# Patient Record
Sex: Female | Born: 1935 | Race: White | Hispanic: No | Marital: Single | Smoking: Never smoker
Health system: Southern US, Community
[De-identification: ages and names within clinical notes are randomized; demographics above are authoritative.]

## PROBLEM LIST (undated history)

## (undated) DIAGNOSIS — I219 Acute myocardial infarction, unspecified: Secondary | ICD-10-CM

## (undated) DIAGNOSIS — Z5189 Encounter for other specified aftercare: Secondary | ICD-10-CM

## (undated) DIAGNOSIS — I2510 Atherosclerotic heart disease of native coronary artery without angina pectoris: Secondary | ICD-10-CM

## (undated) DIAGNOSIS — M810 Age-related osteoporosis without current pathological fracture: Secondary | ICD-10-CM

## (undated) DIAGNOSIS — M1990 Unspecified osteoarthritis, unspecified site: Secondary | ICD-10-CM

## (undated) DIAGNOSIS — E785 Hyperlipidemia, unspecified: Secondary | ICD-10-CM

## (undated) DIAGNOSIS — I10 Essential (primary) hypertension: Secondary | ICD-10-CM

## (undated) HISTORY — DX: Acute myocardial infarction, unspecified: I21.9

## (undated) HISTORY — PX: VALVE REPLACEMENT: SUR13

## (undated) HISTORY — PX: KNEE SURGERY: SHX244

## (undated) HISTORY — DX: Encounter for other specified aftercare: Z51.89

## (undated) HISTORY — DX: Hyperlipidemia, unspecified: E78.5

## (undated) HISTORY — DX: Age-related osteoporosis without current pathological fracture: M81.0

---

## 2016-04-27 DIAGNOSIS — I350 Nonrheumatic aortic (valve) stenosis: Secondary | ICD-10-CM | POA: Insufficient documentation

## 2016-04-27 DIAGNOSIS — I10 Essential (primary) hypertension: Secondary | ICD-10-CM | POA: Insufficient documentation

## 2018-12-06 DIAGNOSIS — G500 Trigeminal neuralgia: Secondary | ICD-10-CM | POA: Insufficient documentation

## 2021-05-17 ENCOUNTER — Encounter (HOSPITAL_BASED_OUTPATIENT_CLINIC_OR_DEPARTMENT_OTHER): Payer: Self-pay

## 2021-05-17 ENCOUNTER — Other Ambulatory Visit: Payer: Self-pay

## 2021-05-17 ENCOUNTER — Encounter: Payer: Self-pay | Admitting: Obstetrics and Gynecology

## 2021-05-17 ENCOUNTER — Inpatient Hospital Stay (HOSPITAL_BASED_OUTPATIENT_CLINIC_OR_DEPARTMENT_OTHER)
Admission: EM | Admit: 2021-05-17 | Discharge: 2021-05-20 | DRG: 382 | Disposition: A | Discharge status: Home / Self Care | Payer: Medicare Other | Attending: Family Medicine | Admitting: Family Medicine

## 2021-05-17 ENCOUNTER — Emergency Department (HOSPITAL_BASED_OUTPATIENT_CLINIC_OR_DEPARTMENT_OTHER): Payer: Medicare Other

## 2021-05-17 DIAGNOSIS — Z888 Allergy status to other drugs, medicaments and biological substances status: Secondary | ICD-10-CM

## 2021-05-17 DIAGNOSIS — K224 Dyskinesia of esophagus: Secondary | ICD-10-CM | POA: Diagnosis present

## 2021-05-17 DIAGNOSIS — Z7982 Long term (current) use of aspirin: Secondary | ICD-10-CM | POA: Diagnosis not present

## 2021-05-17 DIAGNOSIS — K449 Diaphragmatic hernia without obstruction or gangrene: Secondary | ICD-10-CM | POA: Diagnosis present

## 2021-05-17 DIAGNOSIS — I10 Essential (primary) hypertension: Secondary | ICD-10-CM | POA: Diagnosis present

## 2021-05-17 DIAGNOSIS — Z79899 Other long term (current) drug therapy: Secondary | ICD-10-CM

## 2021-05-17 DIAGNOSIS — Z87891 Personal history of nicotine dependence: Secondary | ICD-10-CM

## 2021-05-17 DIAGNOSIS — I2510 Atherosclerotic heart disease of native coronary artery without angina pectoris: Secondary | ICD-10-CM | POA: Diagnosis present

## 2021-05-17 DIAGNOSIS — K253 Acute gastric ulcer without hemorrhage or perforation: Secondary | ICD-10-CM

## 2021-05-17 DIAGNOSIS — B9681 Helicobacter pylori [H. pylori] as the cause of diseases classified elsewhere: Secondary | ICD-10-CM | POA: Diagnosis present

## 2021-05-17 DIAGNOSIS — I350 Nonrheumatic aortic (valve) stenosis: Secondary | ICD-10-CM | POA: Diagnosis present

## 2021-05-17 DIAGNOSIS — K259 Gastric ulcer, unspecified as acute or chronic, without hemorrhage or perforation: Secondary | ICD-10-CM | POA: Diagnosis present

## 2021-05-17 DIAGNOSIS — Z8711 Personal history of peptic ulcer disease: Secondary | ICD-10-CM

## 2021-05-17 DIAGNOSIS — K251 Acute gastric ulcer with perforation: Secondary | ICD-10-CM

## 2021-05-17 DIAGNOSIS — D509 Iron deficiency anemia, unspecified: Secondary | ICD-10-CM | POA: Diagnosis present

## 2021-05-17 DIAGNOSIS — Z20822 Contact with and (suspected) exposure to covid-19: Secondary | ICD-10-CM | POA: Diagnosis present

## 2021-05-17 DIAGNOSIS — Z91018 Allergy to other foods: Secondary | ICD-10-CM

## 2021-05-17 DIAGNOSIS — Z952 Presence of prosthetic heart valve: Secondary | ICD-10-CM | POA: Diagnosis not present

## 2021-05-17 DIAGNOSIS — R109 Unspecified abdominal pain: Secondary | ICD-10-CM | POA: Diagnosis present

## 2021-05-17 HISTORY — DX: Atherosclerotic heart disease of native coronary artery without angina pectoris: I25.10

## 2021-05-17 HISTORY — DX: Essential (primary) hypertension: I10

## 2021-05-17 HISTORY — DX: Unspecified osteoarthritis, unspecified site: M19.90

## 2021-05-17 LAB — URINALYSIS, ROUTINE W REFLEX MICROSCOPIC
Bilirubin Urine: NEGATIVE
Glucose, UA: NEGATIVE mg/dL
Hgb urine dipstick: NEGATIVE
Ketones, ur: 20 mg/dL — AB
Leukocytes,Ua: NEGATIVE
Nitrite: NEGATIVE
Protein, ur: NEGATIVE mg/dL
Specific Gravity, Urine: >1.046 — ABNORMAL HIGH (ref 1.005–1.030)
pH: 5 (ref 5.0–8.0)

## 2021-05-17 LAB — CBC WITH DIFFERENTIAL/PLATELET
Abs Immature Granulocytes: 0.03 10*3/uL (ref 0.00–0.07)
Basophils Absolute: 0.1 10*3/uL (ref 0.0–0.1)
Basophils Relative: 1 %
Eosinophils Absolute: 0 10*3/uL (ref 0.0–0.5)
Eosinophils Relative: 1 %
HCT: 27.2 % — ABNORMAL LOW (ref 36.0–46.0)
Hemoglobin: 8.5 g/dL — ABNORMAL LOW (ref 12.0–15.0)
Immature Granulocytes: 1 %
Lymphocytes Relative: 13 %
Lymphs Abs: 0.8 10*3/uL (ref 0.7–4.0)
MCH: 23.4 pg — ABNORMAL LOW (ref 26.0–34.0)
MCHC: 31.3 g/dL (ref 30.0–36.0)
MCV: 74.7 fL — ABNORMAL LOW (ref 80.0–100.0)
Monocytes Absolute: 0.5 10*3/uL (ref 0.1–1.0)
Monocytes Relative: 7 %
Neutro Abs: 4.8 10*3/uL (ref 1.7–7.7)
Neutrophils Relative %: 77 %
Platelets: 336 10*3/uL (ref 150–400)
RBC: 3.64 MIL/uL — ABNORMAL LOW (ref 3.87–5.11)
RDW: 22.3 % — ABNORMAL HIGH (ref 11.5–15.5)
Smear Review: NORMAL
WBC: 6.2 10*3/uL (ref 4.0–10.5)
nRBC: 0 % (ref 0.0–0.2)

## 2021-05-17 LAB — RESP PANEL BY RT-PCR (FLU A&B, COVID) ARPGX2
Influenza A by PCR: NEGATIVE
Influenza B by PCR: NEGATIVE
SARS Coronavirus 2 by RT PCR: NEGATIVE

## 2021-05-17 LAB — COMPREHENSIVE METABOLIC PANEL
ALT: 13 U/L (ref 0–44)
AST: 17 U/L (ref 15–41)
Albumin: 3.9 g/dL (ref 3.5–5.0)
Alkaline Phosphatase: 67 U/L (ref 38–126)
Anion gap: 11 (ref 5–15)
BUN: 16 mg/dL (ref 8–23)
CO2: 22 mmol/L (ref 22–32)
Calcium: 9.5 mg/dL (ref 8.9–10.3)
Chloride: 103 mmol/L (ref 98–111)
Creatinine, Ser: 0.95 mg/dL (ref 0.44–1.00)
GFR, Estimated: 57 mL/min — ABNORMAL LOW (ref >60)
Glucose, Bld: 121 mg/dL — ABNORMAL HIGH (ref 70–99)
Potassium: 3.9 mmol/L (ref 3.5–5.1)
Sodium: 136 mmol/L (ref 135–145)
Total Bilirubin: 0.4 mg/dL (ref 0.3–1.2)
Total Protein: 7.4 g/dL (ref 6.5–8.1)

## 2021-05-17 LAB — OCCULT BLOOD X 1 CARD TO LAB, STOOL: Fecal Occult Bld: NEGATIVE

## 2021-05-17 LAB — LIPASE, BLOOD: Lipase: 27 U/L (ref 11–51)

## 2021-05-17 MED ORDER — ROSUVASTATIN CALCIUM 10 MG PO TABS
10.0000 mg | ORAL_TABLET | Freq: Every day | ORAL | Status: DC
  Administered 2021-05-17 – 2021-05-20 (×4): 10 mg via ORAL
  Filled 2021-05-17 (×4): qty 1

## 2021-05-17 MED ORDER — IOHEXOL 300 MG/ML  SOLN
100.0000 mL | Freq: Once | INTRAMUSCULAR | Status: AC | PRN
  Administered 2021-05-17: 100 mL via INTRAVENOUS

## 2021-05-17 MED ORDER — LISINOPRIL 20 MG PO TABS
40.0000 mg | ORAL_TABLET | Freq: Every day | ORAL | Status: DC
  Administered 2021-05-17 – 2021-05-20 (×4): 40 mg via ORAL
  Filled 2021-05-17 (×4): qty 2

## 2021-05-17 MED ORDER — SODIUM CHLORIDE 0.9% FLUSH
3.0000 mL | Freq: Two times a day (BID) | INTRAVENOUS | Status: DC
  Administered 2021-05-17 – 2021-05-20 (×6): 3 mL via INTRAVENOUS

## 2021-05-17 MED ORDER — SODIUM CHLORIDE 0.9% FLUSH
3.0000 mL | INTRAVENOUS | Status: DC | PRN
  Administered 2021-05-18: 3 mL via INTRAVENOUS

## 2021-05-17 MED ORDER — PANTOPRAZOLE SODIUM 40 MG IV SOLR
40.0000 mg | Freq: Once | INTRAVENOUS | Status: AC
  Administered 2021-05-17: 40 mg via INTRAVENOUS
  Filled 2021-05-17: qty 40

## 2021-05-17 MED ORDER — AMLODIPINE BESYLATE 5 MG PO TABS
5.0000 mg | ORAL_TABLET | Freq: Every day | ORAL | Status: DC
  Administered 2021-05-17 – 2021-05-20 (×4): 5 mg via ORAL
  Filled 2021-05-17 (×4): qty 1

## 2021-05-17 MED ORDER — PANTOPRAZOLE SODIUM 40 MG IV SOLR
40.0000 mg | Freq: Two times a day (BID) | INTRAVENOUS | Status: DC
  Administered 2021-05-17 – 2021-05-18 (×3): 40 mg via INTRAVENOUS
  Filled 2021-05-17 (×3): qty 40

## 2021-05-17 MED ORDER — ONDANSETRON 4 MG PO TBDP: 4.0000 mg | ORAL_TABLET | Freq: Four times a day (QID) | ORAL | Status: DC | PRN

## 2021-05-17 MED ORDER — SODIUM CHLORIDE 0.9 % IV SOLN: 250.0000 mL | INTRAVENOUS | Status: DC | PRN

## 2021-05-17 NOTE — ED Provider Notes (Signed)
[facility] EMERGENCY DEPARTMENT Provider Note   CSN: [id number] Arrival date & time: 05/17/21  0915     History Chief Complaint  Patient presents with   Abdominal Pain    Erika Cardenas is a 87 y.o. female.  She has a history of aortic valve replacement, hypertension, trigeminal neuralgia.  Complaining of 1 month of intermittent nausea and vomiting, often occurs a few hours after eating.  Felt a little constipated which improved with some Ex-Lax.  No urinary symptoms.  No fevers chills cough.  No blood in the vomitus.  She has her upper abdomen is sore due to forcing vomiting.  She thinks she might of lossed a little weight.  No abdominal surgeries.  Sometimes gets some back pain which improves with lying down.  The history is provided by the patient.  Emesis Severity:  Moderate Duration:  1 month Timing:  Sporadic Emesis appearance: "slime" Progression:  Unchanged Chronicity:  New Recent urination:  Normal Relieved by:  Nothing Exacerbated by: eating. Associated symptoms: no abdominal pain, no cough, no diarrhea, no fever, no headaches and no sore throat       Past Medical History:  Diagnosis Date   Arthritis    Coronary artery disease    Hypertension     There are no problems to display for this patient.   Past Surgical History:  Procedure Laterality Date   KNEE SURGERY     VALVE REPLACEMENT       OB History   No obstetric history on file.     History reviewed. No pertinent family history.  Social History   Tobacco Use   Smoking status: Never   Smokeless tobacco: Never  Substance Use Topics   Alcohol use: Never   Drug use: Never    Home Medications Prior to Admission medications   Not on File    Allergies    Coffea arabica and Lac bovis  Review of Systems   Review of Systems  Constitutional:  Negative for fever.  HENT:  Negative for sore throat.   Eyes:  Negative for visual disturbance.  Respiratory:  Negative for cough and  shortness of breath.   Cardiovascular:  Negative for chest pain.  Gastrointestinal:  Positive for nausea and vomiting. Negative for abdominal pain and diarrhea.  Genitourinary:  Negative for dysuria.  Musculoskeletal:  Positive for back pain.  Skin:  Negative for rash.  Neurological:  Negative for headaches.   Physical Exam Updated Vital Signs BP (!) 159/61 (BP Location: Right Arm)   Pulse 72   Temp 97.8 F (36.6 C) (Oral)   Resp 16   Ht 5' (1.524 m)   Wt 60.2 kg   SpO2 100%   BMI 25.92 kg/m   Physical Exam Vitals and nursing note reviewed.  Constitutional:      General: She is not in acute distress.    Appearance: Normal appearance. She is well-developed.  HENT:     Head: Normocephalic and atraumatic.  Eyes:     Conjunctiva/sclera: Conjunctivae normal.  Cardiovascular:     Rate and Rhythm: Normal rate and regular rhythm.     Heart sounds: Murmur heard.  Pulmonary:     Effort: Pulmonary effort is normal. No respiratory distress.     Breath sounds: Normal breath sounds.  Abdominal:     General: Bowel sounds are normal.     Palpations: Abdomen is soft.     Tenderness: There is no abdominal tenderness. There is no guarding or rebound.  Musculoskeletal:        General: No deformity or signs of injury. Normal range of motion.     Cervical back: Neck supple.  Skin:    General: Skin is warm and dry.  Neurological:     General: No focal deficit present.     Mental Status: She is alert.    ED Results / Procedures / Treatments   Labs (all labs ordered are listed, but only abnormal results are displayed) Labs Reviewed  COMPREHENSIVE METABOLIC PANEL - Abnormal; Notable for the following components:      Result Value   Glucose, Bld 121 (*)    GFR, Estimated 57 (*)    All other components within normal limits  CBC WITH DIFFERENTIAL/PLATELET - Abnormal; Notable for the following components:   RBC 3.64 (*)    Hemoglobin 8.5 (*)    HCT 27.2 (*)    MCV 74.7 (*)    MCH  23.4 (*)    RDW 22.3 (*)    All other components within normal limits  RESP PANEL BY RT-PCR (FLU A&B, COVID) ARPGX2  LIPASE, BLOOD  OCCULT BLOOD X 1 CARD TO LAB, STOOL  URINALYSIS, ROUTINE W REFLEX MICROSCOPIC  BASIC METABOLIC PANEL  CBC    EKG EKG Interpretation  Date/Time:  Monday May 17 2021 09:31:33 EDT Ventricular Rate:  71 PR Interval:  181 QRS Duration: 144 QT Interval:  451 QTC Calculation: 491 R Axis:   -66 Text Interpretation: Sinus rhythm Atrial premature complexes Right bundle branch block LVH with IVCD and secondary repol abnrm Borderline prolonged QT interval No old tracing to compare Confirmed by Meridee Score [phone]) on 05/17/2021 9:42:35 AM  Radiology CT ABDOMEN PELVIS W CONTRAST  Result Date: 05/17/2021 CLINICAL DATA:  Nausea, vomiting, epigastric pain for 4 weeks, back pain, weight loss EXAM: CT ABDOMEN AND PELVIS WITH CONTRAST TECHNIQUE: Multidetector CT imaging of the abdomen and pelvis was performed using the standard protocol following bolus administration of intravenous contrast. CONTRAST:  OMNIPAQUE IOHEXOL 300 MG/ML  SOLN COMPARISON:  None. FINDINGS: Lower chest: No acute abnormality. Moderate hiatal hernia, incompletely imaged, with intrathoracic position of the gastric fundus. Hepatobiliary: No solid liver abnormality is seen. No gallstones, gallbladder wall thickening, or biliary dilatation. Pancreas: Unremarkable. No pancreatic ductal dilatation or surrounding inflammatory changes. Spleen: Normal in size without significant abnormality. Adrenals/Urinary Tract: Adrenal glands are unremarkable. Kidneys are normal, without renal calculi, solid lesion, or hydronephrosis. Bladder is unremarkable. Stomach/Bowel: There is mucosal thickening, edema, and small loculations of extraluminal air about the gastric fundus at the level of the diaphragmatic hiatus (series 2, image 9). Appendix appears normal. No evidence of bowel wall thickening, distention, or  inflammatory changes. Pancolonic diverticulosis. Vascular/Lymphatic: Aortic atherosclerosis. No enlarged abdominal or pelvic lymph nodes. Reproductive: No mass or other significant abnormality. Other: No abdominal wall hernia or abnormality. No abdominopelvic ascites. Musculoskeletal: No acute or significant osseous findings. IMPRESSION: 1. There is mucosal thickening, edema, and small loculations of extraluminal air about the gastric fundus at the level of the diaphragmatic hiatus. Findings are of concerning for gastric ulcer complicated by microperforation. Underlying malignancy is a significant differential consideration. 2. Moderate hiatal hernia, incompletely imaged, with intrathoracic position of the gastric fundus. 3. Pancolonic diverticulosis without evidence of acute diverticulitis. Aortic Atherosclerosis (ICD10-I70.0). Electronically Signed   By: Lauralyn Primes M.D.   On: 05/17/2021 11:32    Procedures Procedures   Medications Ordered in ED Medications  sodium chloride flush (NS) 0.9 % injection 3 mL (3 mLs  Intravenous Given 05/17/21 1615)  sodium chloride flush (NS) 0.9 % injection 3 mL (has no administration in time range)  0.9 %  sodium chloride infusion (has no administration in time range)  amLODipine (NORVASC) tablet 5 mg (5 mg Oral Given 05/17/21 1614)  lisinopril (ZESTRIL) tablet 40 mg (40 mg Oral Given 05/17/21 1614)  rosuvastatin (CRESTOR) tablet 10 mg (10 mg Oral Given 05/17/21 1615)  pantoprazole (PROTONIX) injection 40 mg (has no administration in time range)  ondansetron (ZOFRAN-ODT) disintegrating tablet 4 mg (has no administration in time range)  iohexol (OMNIPAQUE) 300 MG/ML solution 100 mL (100 mLs Intravenous Contrast Given 05/17/21 1047)  pantoprazole (PROTONIX) injection 40 mg (40 mg Intravenous Given 05/17/21 1206)    ED Course  I have reviewed the triage vital signs and the nursing notes.  Pertinent labs & imaging results that were available during my care of the  patient were reviewed by me and considered in my medical decision making (see chart for details).  Clinical Course as of 05/17/21 1744  Mon May 17, 2021  1157 Rectal exam done with tech [facility] as chaperone.  No stool in vault.  Sent to lab for guaiac. [MB]  1208 Discussed with Dr. Matthias Hughs from [facility].  He is recommending PPI and admission to the hospital at [facility] and they will evaluate her for possible endoscopy. [MB]  1225 Discussed with [facility] hospitalist Dr. Ashok Pall who accepts the patient for admission. [MB]    Clinical Course User Index [MB] Terrilee Files, MD   MDM Rules/Calculators/A&P                         This patient complains of nausea and vomiting, intermittent upper abdominal pain and back pain weight loss; this involves an extensive number of treatment Options and is a complaint that carries with it a high risk of complications and Morbidity. The differential includes cholelithiasis, cholecystitis, peptic ulcer disease, hiatal hernia, pancreatitis, colitis  I ordered, reviewed and interpreted labs, which included CBC with normal white count, hemoglobin low unclear baseline, chemistries and LFTs fairly normal, fecal occult negative, COVID and flu negative I ordered medication IV PPI I ordered imaging studies which included CT abdomen and pelvis with contrast and I independently    visualized and interpreted imaging which showed large hiatal hernia, gastric wall thickening and microperforations concerning for gastric ulcer Additional history obtained from patient's niece Previous records obtained and reviewed in epic, no recent admissions I consulted Dr. Vivien Rossetti GI and Dr. Ashok Pall [facility] hospitalist and discussed lab and imaging findings  Critical Interventions: None  After the interventions stated above, I reevaluated the patient and found patient to be fairly asymptomatic.  She is agreeable to admission to [facility] for further evaluation of her  symptoms.   Final Clinical Impression(s) / ED Diagnoses Final diagnoses:  Acute gastric ulcer with perforation [facility])    Rx / DC Orders ED Discharge Orders     None        Terrilee Files, MD 05/17/21 1747

## 2021-05-17 NOTE — ED Notes (Signed)
Presents with abd pain for the past month, accompanied with nausea, intermittent pain in back as well. Family states she has had some weight loss as well.

## 2021-05-17 NOTE — ED Notes (Signed)
ED MD at bedside to explain additional care, exam and need for GI consult and admission

## 2021-05-17 NOTE — ED Notes (Signed)
[facility] at bedside 

## 2021-05-17 NOTE — ED Notes (Signed)
Covid Swab obtained and to the lab 

## 2021-05-17 NOTE — ED Notes (Signed)
ED Provider at bedside. 

## 2021-05-17 NOTE — ED Notes (Signed)
Informed that urine spec is requested, pt states unable to void at this time.

## 2021-05-17 NOTE — ED Triage Notes (Signed)
Pt c/o abdominal pain and vomiting for 4 weeks. Becomes nauseous after eating.  Had some constipation which resolved with miralax. Denies fevers.  C/o back pain at arrival

## 2021-05-17 NOTE — ED Notes (Signed)
PHONE HANDOFF REPORT PROVIDED TO [facility] RN AT [facility]

## 2021-05-17 NOTE — Consult Note (Signed)
Referring Provider: No ref. provider found Primary Care Physician:  Pcp, No Primary Gastroenterologist: None (unassigned)  Reason for Consultation: Complicated hiatal hernia  HPI: Erika Cardenas is a 87 y.o. female admitted to the hospital in transfer from [facility], where she presented with a 1 month history of intermittent nonspecific upper abdominal symptoms, including some mild intermittent upper abdominal discomfort, and some periodic postprandial vomiting and dry heaves.  This was enough to make her change her normal dietary routine, and her nieces, who help to look after her, finally prevailed on her to seek medical attention.    At the [facility], a CT scan showed a moderately large hiatal hernia with contained extraluminal air at the margin of the diaphragmatic hiatus.  However, the patient had a benign exam, no fever, no white count elevation, and was hungry and asking for food.  Of note, there has been an unintentional roughly 20 pound weight loss over the past year although the patient is adamant that she has an excellent appetite.  She does not have dyspeptic symptoms on a chronic basis, such as reflux (very rarely uses Tums), dysphagia, chronic nausea.   Past Medical History:  Diagnosis Date   Arthritis    Coronary artery disease    Hypertension     Past Surgical History:  Procedure Laterality Date   KNEE SURGERY     VALVE REPLACEMENT      Prior to Admission medications   Medication Sig Start Date End Date Taking? Authorizing Provider  amLODipine (NORVASC) 5 MG tablet Take 5 mg by mouth daily.   Yes [provider]  aspirin EC 81 MG tablet Take 81 mg by mouth daily. Swallow whole.   Yes [provider]  carbamazepine (TEGRETOL XR) 100 MG 12 hr tablet Take 100 mg by mouth 2 (two) times daily.   Yes [provider]  lisinopril (ZESTRIL) 40 MG tablet Take 40 mg by mouth daily.   Yes [provider]  rosuvastatin  (CRESTOR) 10 MG tablet Take 10 mg by mouth daily.   Yes [provider]    Current Facility-Administered Medications  Medication Dose Route Frequency Provider Last Rate Last Admin   0.9 %  sodium chloride infusion  250 mL Intravenous PRN Wouk, Wilfred Curtis, MD       amLODipine (NORVASC) tablet 5 mg  5 mg Oral Daily Kathrynn Running, MD   5 mg at 05/17/21 1614   lisinopril (ZESTRIL) tablet 40 mg  40 mg Oral Daily Kathrynn Running, MD   40 mg at 05/17/21 1614   pantoprazole (PROTONIX) injection 40 mg  40 mg Intravenous Q12H Wouk, Wilfred Curtis, MD       rosuvastatin (CRESTOR) tablet 10 mg  10 mg Oral Daily Kathrynn Running, MD   10 mg at 05/17/21 1615   sodium chloride flush (NS) 0.9 % injection 3 mL  3 mL Intravenous Q12H Kathrynn Running, MD   3 mL at 05/17/21 1615   sodium chloride flush (NS) 0.9 % injection 3 mL  3 mL Intravenous PRN Kathrynn Running, MD        Allergies as of 05/17/2021 - Review Complete 05/17/2021  Allergen Reaction Noted   Coffea arabica Nausea Only 12/06/2018   Lac bovis Diarrhea 12/06/2018    History reviewed. No pertinent family history.  Social History   Socioeconomic History   Marital status: Single    Spouse name: Not on file   Number of children:  Not on file   Years of education: Not on file   Highest education level: Not on file  Occupational History   Not on file  Tobacco Use   Smoking status: Never   Smokeless tobacco: Never  Substance and Sexual Activity   Alcohol use: Never   Drug use: Never   Sexual activity: Not on file  Other Topics Concern   Not on file  Social History Narrative   Not on file   Social Determinants of Health   Financial Resource Strain: Not on file  Food Insecurity: Not on file  Transportation Needs: Not on file  Physical Activity: Not on file  Stress: Not on file  Social Connections: Not on file  Intimate Partner Violence: Not on file    Review of Systems: History of remote heart valve  replacement, (bovine valve) on daily aspirin  Physical Exam: Vital signs in last 24 hours: Temp:  [97.5 F (36.4 C)-97.8 F (36.6 C)] 97.5 F (36.4 C) (06/20 1412) Pulse Rate:  [60-72] 61 (06/20 1412) Resp:  [11-22] 16 (06/20 1412) BP: (141-170)/(51-82) 170/58 (06/20 1412) SpO2:  [100 %] 100 % (06/20 1412) Weight:  [60.2 kg] 60.2 kg (06/20 0927)   General:   Alert, very spry 87 year old, well-developed, somewhat thin but well-nourished, pleasant and cooperative in NAD Head:  Normocephalic and atraumatic. Eyes:  Sclera clear, no icterus.   Conjunctiva pink. Mouth:   No ulcerations or lesions.  Oropharynx pink & moist. Neck:   No masses or thyromegaly. Lungs:  Clear throughout to auscultation.   No wheezes, crackles, or rhonchi. No evident respiratory distress. Heart:   Regular rate and rhythm; no murmurs, clicks, rubs,  or gallops. Abdomen:  Soft, nontender, and nondistended. No masses, hepatosplenomegaly or ventral hernias noted.  Quiet bowel sounds, with no succussion splash, guarding, or rebound.   Msk:   Symmetrical without gross deformities. Pulses:  Normal radial pulse is noted. Extremities:   Without clubbing, cyanosis, or edema. Neurologic:  Alert and coherent;  grossly normal neurologically. Skin:  Intact without significant lesions or rashes. Cervical Nodes:  No significant cervical adenopathy. Psych:   Alert and cooperative. Normal mood and affect.  Intake/Output from previous day: No intake/output data recorded. Intake/Output this shift: No intake/output data recorded.  Lab Results: Recent Labs    05/17/21 0953  WBC 6.2  HGB 8.5*  HCT 27.2*  PLT 336   BMET Recent Labs    05/17/21 0953  NA 136  K 3.9  CL 103  CO2 22  GLUCOSE 121*  BUN 16  CREATININE 0.95  CALCIUM 9.5   LFT Recent Labs    05/17/21 0953  PROT 7.4  ALBUMIN 3.9  AST 17  ALT 13  ALKPHOS 67  BILITOT 0.4   PT/INR No results for input(s): LABPROT, INR in the last 72  hours.  Studies/Results: CT ABDOMEN PELVIS W CONTRAST  Result Date: 05/17/2021 CLINICAL DATA:  Nausea, vomiting, epigastric pain for 4 weeks, back pain, weight loss EXAM: CT ABDOMEN AND PELVIS WITH CONTRAST TECHNIQUE: Multidetector CT imaging of the abdomen and pelvis was performed using the standard protocol following bolus administration of intravenous contrast. CONTRAST:  OMNIPAQUE IOHEXOL 300 MG/ML  SOLN COMPARISON:  None. FINDINGS: Lower chest: No acute abnormality. Moderate hiatal hernia, incompletely imaged, with intrathoracic position of the gastric fundus. Hepatobiliary: No solid liver abnormality is seen. No gallstones, gallbladder wall thickening, or biliary dilatation. Pancreas: Unremarkable. No pancreatic ductal dilatation or surrounding inflammatory changes. Spleen: Normal in size without  significant abnormality. Adrenals/Urinary Tract: Adrenal glands are unremarkable. Kidneys are normal, without renal calculi, solid lesion, or hydronephrosis. Bladder is unremarkable. Stomach/Bowel: There is mucosal thickening, edema, and small loculations of extraluminal air about the gastric fundus at the level of the diaphragmatic hiatus (series 2, image 9). Appendix appears normal. No evidence of bowel wall thickening, distention, or inflammatory changes. Pancolonic diverticulosis. Vascular/Lymphatic: Aortic atherosclerosis. No enlarged abdominal or pelvic lymph nodes. Reproductive: No mass or other significant abnormality. Other: No abdominal wall hernia or abnormality. No abdominopelvic ascites. Musculoskeletal: No acute or significant osseous findings. IMPRESSION: 1. There is mucosal thickening, edema, and small loculations of extraluminal air about the gastric fundus at the level of the diaphragmatic hiatus. Findings are of concerning for gastric ulcer complicated by microperforation. Underlying malignancy is a significant differential consideration. 2. Moderate hiatal hernia, incompletely imaged,  with intrathoracic position of the gastric fundus. 3. Pancolonic diverticulosis without evidence of acute diverticulitis. Aortic Atherosclerosis (ICD10-I70.0). Electronically Signed   By: Lauralyn Primes M.D.   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  Plan: 1.  Endoscopic evaluation tomorrow for further clarification of the patient's mucosal status.  I believe this can be safely accomplished without significant risk of causing further escape of extraluminal air, although I did mention that as a possible risk to the patient and her niece is at the bedside.  Risk of perforation, bleeding, and anesthesia problems reviewed.  2.  I believe it is safe for the patient to have a full liquid diet for the time being, so I will order that  3.  Upper GI series, hopefully to follow her endoscopy tomorrow, for better anatomic definition of her hiatal hernia  4.  I broached the idea that this might be the kind of clinical circumstance which would benefit from surgical intervention, but we will await the results of tomorrow's tests before discussing the option of inpatient surgical consultation versus outpatient evaluation.   LOS: 0 days   Katy Fitch Linkyn Gobin  05/17/2021, 4:29 PM   Pager [phone] If no answer or after 5 PM call [phone]

## 2021-05-17 NOTE — H&P (Signed)
History and Physical    Erika Cardenas VQQ:[id number] DOB: [birth year] DOA: 05/17/2021  PCP: Pcp, No  Patient coming from: home   Chief Complaint: abdominal pain, nausea and vomiting  HPI: Erika Cardenas is a 87 y.o. female with medical history significant for aortic stenosis s/p repair, htn, who presents with the above.  She reports history of an ulcer when she was a teenage, otherwise denies history gastric pathology. She denies melena or hematochezia. She takes a daily aspirin but otherwise no nsaids. She is not a drinker.  She reports one month of nausea and small-volume emesis (mainly mucous) after eating. Also has abdominal pain when this happens. When she doesn't eat she does not have abdominal pain or nausea/vomiting. No fevers, no cough. No recent hx of endoscopy.  ED Course:   Imaging shows signs possible gastric ulcer w/ microperforation. Case discussed w/ [facility],   Review of Systems: As per HPI otherwise 10 point review of systems negative.    Past Medical History:  Diagnosis Date   Arthritis    Coronary artery disease    Hypertension     Past Surgical History:  Procedure Laterality Date   KNEE SURGERY     VALVE REPLACEMENT       reports that she has never smoked. She has never used smokeless tobacco. She reports that she does not drink alcohol and does not use drugs.  Allergies  Allergen Reactions   Coffea Arabica Nausea Only   Lac Bovis Diarrhea    History reviewed. No pertinent family history.  Prior to Admission medications   Medication Sig Start Date End Date Taking? Authorizing Provider  amLODipine (NORVASC) 5 MG tablet Take 5 mg by mouth daily.   Yes [provider]  aspirin EC 81 MG tablet Take 81 mg by mouth daily. Swallow whole.   Yes [provider]  lisinopril (ZESTRIL) 40 MG tablet Take 40 mg by mouth daily.   Yes [provider]  rosuvastatin (CRESTOR) 10 MG tablet Take 10 mg by mouth daily.   Yes [provider]    Physical Exam: Vitals:   05/17/21 1200 05/17/21 1230 05/17/21 1300 05/17/21 1412  BP: (!) 147/51 (!) 153/58 (!) 141/77 (!) 170/58  Pulse: 60 68 68 61  Resp: 13 (!) 22 17 16   Temp:    (!) 97.5 F (36.4 C)  TempSrc:    Oral  SpO2: 100% 100% 100% 100%  Weight:      Height:        Constitutional: No acute distress Head: Atraumatic Eyes: Conjunctiva clear ENM: Moist mucous membranes. Normal dentition.  Neck: Supple Respiratory: Clear to auscultation bilaterally, no wheezing/rales/rhonchi. Normal respiratory effort. No accessory muscle use. . Cardiovascular: Regular rate and rhythm. Soft systolic murmur Abdomen: Non-tender, non-distended. No masses. No rebound or guarding. Positive bowel sounds. Musculoskeletal: No joint deformity upper and lower extremities. Normal ROM, no contractures. Normal muscle tone.  Skin: No rashes, lesions, or ulcers.  Extremities: No peripheral edema. Palpable peripheral pulses. Neurologic: Alert, moving all 4 extremities. Psychiatric: Normal insight and judgement.   Labs on Admission: I have personally reviewed following labs and imaging studies  CBC: Recent Labs  Lab 05/17/21 0953  WBC 6.2  NEUTROABS 4.8  HGB 8.5*  HCT 27.2*  MCV 74.7*  PLT 336   Basic Metabolic Panel: Recent Labs  Lab 05/17/21 0953  NA 136  K 3.9  CL 103  CO2 22  GLUCOSE 121*  BUN 16  CREATININE 0.95  CALCIUM  9.5   GFR: Estimated Creatinine Clearance: 31.9 mL/min (by C-G formula based on SCr of 0.95 mg/dL). Liver Function Tests: Recent Labs  Lab 05/17/21 0953  AST 17  ALT 13  ALKPHOS 67  BILITOT 0.4  PROT 7.4  ALBUMIN 3.9   Recent Labs  Lab 05/17/21 0953  LIPASE 27   No results for input(s): AMMONIA in the last 168 hours. Coagulation Profile: No results for input(s): INR, PROTIME in the last 168 hours. Cardiac Enzymes: No results for input(s): CKTOTAL, CKMB, CKMBINDEX, TROPONINI in the last 168 hours. BNP (last 3 results) No results for  input(s): PROBNP in the last 8760 hours. HbA1C: No results for input(s): HGBA1C in the last 72 hours. CBG: No results for input(s): GLUCAP in the last 168 hours. Lipid Profile: No results for input(s): CHOL, HDL, LDLCALC, TRIG, CHOLHDL, LDLDIRECT in the last 72 hours. Thyroid Function Tests: No results for input(s): TSH, T4TOTAL, FREET4, T3FREE, THYROIDAB in the last 72 hours. Anemia Panel: No results for input(s): VITAMINB12, FOLATE, FERRITIN, TIBC, IRON, RETICCTPCT in the last 72 hours. Urine analysis: No results found for: COLORURINE, APPEARANCEUR, LABSPEC, PHURINE, GLUCOSEU, HGBUR, BILIRUBINUR, KETONESUR, PROTEINUR, UROBILINOGEN, NITRITE, LEUKOCYTESUR  Radiological Exams on Admission: CT ABDOMEN PELVIS W CONTRAST  Result Date: 05/17/2021 CLINICAL DATA:  Nausea, vomiting, epigastric pain for 4 weeks, back pain, weight loss EXAM: CT ABDOMEN AND PELVIS WITH CONTRAST TECHNIQUE: Multidetector CT imaging of the abdomen and pelvis was performed using the standard protocol following bolus administration of intravenous contrast. CONTRAST:  OMNIPAQUE IOHEXOL 300 MG/ML  SOLN COMPARISON:  None. FINDINGS: Lower chest: No acute abnormality. Moderate hiatal hernia, incompletely imaged, with intrathoracic position of the gastric fundus. Hepatobiliary: No solid liver abnormality is seen. No gallstones, gallbladder wall thickening, or biliary dilatation. Pancreas: Unremarkable. No pancreatic ductal dilatation or surrounding inflammatory changes. Spleen: Normal in size without significant abnormality. Adrenals/Urinary Tract: Adrenal glands are unremarkable. Kidneys are normal, without renal calculi, solid lesion, or hydronephrosis. Bladder is unremarkable. Stomach/Bowel: There is mucosal thickening, edema, and small loculations of extraluminal air about the gastric fundus at the level of the diaphragmatic hiatus (series 2, image 9). Appendix appears normal. No evidence of bowel wall thickening, distention,  or inflammatory changes. Pancolonic diverticulosis. Vascular/Lymphatic: Aortic atherosclerosis. No enlarged abdominal or pelvic lymph nodes. Reproductive: No mass or other significant abnormality. Other: No abdominal wall hernia or abnormality. No abdominopelvic ascites. Musculoskeletal: No acute or significant osseous findings. IMPRESSION: 1. There is mucosal thickening, edema, and small loculations of extraluminal air about the gastric fundus at the level of the diaphragmatic hiatus. Findings are of concerning for gastric ulcer complicated by microperforation. Underlying malignancy is a significant differential consideration. 2. Moderate hiatal hernia, incompletely imaged, with intrathoracic position of the gastric fundus. 3. Pancolonic diverticulosis without evidence of acute diverticulitis. Aortic Atherosclerosis (ICD10-I70.0). Electronically Signed   By: Lauralyn Primes M.D.   On: 05/17/2021 11:32      Assessment/Plan Active Problems:   Gastric ulcer    # Gastric ulcer? 1 month progressive nausea/vomiting and abd pain associated with eating. CT shows mucosal thickening, edema, and small loculations of extraluminal air about the gastric fundus concerning for gastric ulcer w/ microperforatioin. Malignancy also on ddx. Hemodynamically stable. Also with anemia, likely subacute as no report of melena/hematochezia and fecal occult blood neg in the ED. Is on asa at home - NPO - PPI BID - GI consulted, will see - hold home asa - OF NOTE, THE PATIENT REQUESTS THAT WE DO NOT SHARE ANY BAD  NEWS WITH HER. INSTEAD, SHE REQUESTS WE ADVISE HER NIECE, [name], WHO IS HER PRIMARY CONTACT  # Anemia H 8.5, no priors available for comparison. No hematochezia or melena, fecal occult blood neg, suspect this is subacute. Asymptomatic - monitor  # Aortic stenosis S/p repair with biologic. No signs CHF  # HTN Here bp mildly elevated - cont home lisinopril, amlodipine, rosuvastatin    DVT prophylaxis: SCDs Code  Status: full  Family Communication: niece karen updated telephonically 6/20  Consults called: gi   Level of care: Med-Surg Status is: Inpatient  Remains inpatient appropriate because:Inpatient level of care appropriate due to severity of illness  Dispo: The patient is from: Home              Anticipated d/c is to: Home              Patient currently is not medically stable to d/c.   Difficult to place patient No        Silvano Bilis MD [facility] Pager [phone]  If 7PM-7AM, please contact night-coverage [email or web address] Password [facility]  05/17/2021, 2:48 PM

## 2021-05-18 ENCOUNTER — Inpatient Hospital Stay (HOSPITAL_COMMUNITY): Payer: Medicare Other | Admitting: Certified Registered"

## 2021-05-18 ENCOUNTER — Encounter (HOSPITAL_COMMUNITY)
Admission: EM | Disposition: A | Discharge status: Home / Self Care | Payer: Self-pay | Source: Home / Self Care | Attending: Family Medicine

## 2021-05-18 ENCOUNTER — Encounter (HOSPITAL_COMMUNITY): Payer: Self-pay | Admitting: Obstetrics and Gynecology

## 2021-05-18 ENCOUNTER — Inpatient Hospital Stay (HOSPITAL_COMMUNITY): Payer: Medicare Other

## 2021-05-18 HISTORY — PX: BIOPSY: SHX5522

## 2021-05-18 HISTORY — PX: ESOPHAGOGASTRODUODENOSCOPY (EGD) WITH PROPOFOL: SHX5813

## 2021-05-18 LAB — CBC
HCT: 25 % — ABNORMAL LOW (ref 36.0–46.0)
Hemoglobin: 7.4 g/dL — ABNORMAL LOW (ref 12.0–15.0)
MCH: 22.4 pg — ABNORMAL LOW (ref 26.0–34.0)
MCHC: 29.6 g/dL — ABNORMAL LOW (ref 30.0–36.0)
MCV: 75.8 fL — ABNORMAL LOW (ref 80.0–100.0)
Platelets: 265 10*3/uL (ref 150–400)
RBC: 3.3 MIL/uL — ABNORMAL LOW (ref 3.87–5.11)
RDW: 22.1 % — ABNORMAL HIGH (ref 11.5–15.5)
WBC: 4.6 10*3/uL (ref 4.0–10.5)
nRBC: 0 % (ref 0.0–0.2)

## 2021-05-18 LAB — HEMOGLOBIN AND HEMATOCRIT, BLOOD
HCT: 26.8 % — ABNORMAL LOW (ref 36.0–46.0)
Hemoglobin: 7.6 g/dL — ABNORMAL LOW (ref 12.0–15.0)

## 2021-05-18 LAB — BASIC METABOLIC PANEL
Anion gap: 9 (ref 5–15)
BUN: 13 mg/dL (ref 8–23)
CO2: 22 mmol/L (ref 22–32)
Calcium: 9 mg/dL (ref 8.9–10.3)
Chloride: 107 mmol/L (ref 98–111)
Creatinine, Ser: 0.89 mg/dL (ref 0.44–1.00)
GFR, Estimated: >60 mL/min (ref >60)
Glucose, Bld: 93 mg/dL (ref 70–99)
Potassium: 3.8 mmol/L (ref 3.5–5.1)
Sodium: 138 mmol/L (ref 135–145)

## 2021-05-18 LAB — ABO/RH: ABO/RH(D): O POS

## 2021-05-18 SURGERY — ESOPHAGOGASTRODUODENOSCOPY (EGD) WITH PROPOFOL
Anesthesia: Monitor Anesthesia Care

## 2021-05-18 MED ORDER — PROPOFOL 10 MG/ML IV BOLUS
INTRAVENOUS | Status: AC
  Filled 2021-05-18: qty 20

## 2021-05-18 MED ORDER — PROPOFOL 500 MG/50ML IV EMUL
INTRAVENOUS | Status: DC | PRN
  Administered 2021-05-18: 75 ug/kg/min via INTRAVENOUS

## 2021-05-18 MED ORDER — PROPOFOL 500 MG/50ML IV EMUL
INTRAVENOUS | Status: AC
  Filled 2021-05-18: qty 50

## 2021-05-18 MED ORDER — BOOST / RESOURCE BREEZE PO LIQD CUSTOM
1.0000 | Freq: Three times a day (TID) | ORAL | Status: DC
  Administered 2021-05-18 – 2021-05-20 (×5): 1 via ORAL

## 2021-05-18 MED ORDER — LACTATED RINGERS IV SOLN
INTRAVENOUS | Status: AC | PRN
  Administered 2021-05-18: 1000 mL via INTRAVENOUS

## 2021-05-18 MED ORDER — PROPOFOL 10 MG/ML IV BOLUS
INTRAVENOUS | Status: DC | PRN
  Administered 2021-05-18: 10 mg via INTRAVENOUS

## 2021-05-18 MED ORDER — SUCRALFATE 1 GM/10ML PO SUSP
1.0000 g | Freq: Three times a day (TID) | ORAL | Status: DC
  Administered 2021-05-18 – 2021-05-19 (×4): 1 g via ORAL
  Filled 2021-05-18 (×4): qty 10

## 2021-05-18 SURGICAL SUPPLY — 14 items

## 2021-05-18 NOTE — Anesthesia Preprocedure Evaluation (Signed)
Anesthesia Evaluation  Patient identified by MRN, date of birth, ID band Patient awake    Reviewed: Allergy & Precautions, NPO status , Patient's Chart, lab work & pertinent test results  Airway Mallampati: II  TM Distance: >3 FB Neck ROM: Full    Dental  (+) Teeth Intact   Pulmonary neg pulmonary ROS,    Pulmonary exam normal        Cardiovascular hypertension, Pt. on medications + CAD  + Valvular Problems/Murmurs (s/p AVR) AS  Rhythm:Regular Rate:Normal     Neuro/Psych negative neurological ROS  negative psych ROS   GI/Hepatic Neg liver ROS, PUD,   Endo/Other  negative endocrine ROS  Renal/GU negative Renal ROS  negative genitourinary   Musculoskeletal  (+) Arthritis ,   Abdominal (+)  Abdomen: soft. Bowel sounds: normal.  Peds  Hematology negative hematology ROS (+)   Anesthesia Other Findings   Reproductive/Obstetrics                             Anesthesia Physical Anesthesia Plan  ASA: 3  Anesthesia Plan: MAC   Post-op Pain Management:    Induction: Intravenous  PONV Risk Score and Plan: 2 and Propofol infusion and Treatment may vary due to age or medical condition  Airway Management Planned: Simple Face Mask, Natural Airway and Nasal Cannula  Additional Equipment: None  Intra-op Plan:   Post-operative Plan:   Informed Consent: I have reviewed the patients History and Physical, chart, labs and discussed the procedure including the risks, benefits and alternatives for the proposed anesthesia with the patient or authorized representative who has indicated his/her understanding and acceptance.     Dental advisory given  Plan Discussed with: CRNA  Anesthesia Plan Comments: (Lab Results      Component                Value               Date                      WBC                      4.6                 05/18/2021                HGB                      7.4 (L)              05/18/2021                HCT                      25.0 (L)            05/18/2021                MCV                      75.8 (L)            05/18/2021                PLT                      265  05/18/2021           Lab Results      Component                Value               Date                      NA                       138                 05/18/2021                K                        3.8                 05/18/2021                CO2                      22                  05/18/2021                GLUCOSE                  93                  05/18/2021                BUN                      13                  05/18/2021                CREATININE               0.89                05/18/2021                CALCIUM                  9.0                 05/18/2021                GFRNONAA                 >60                 05/18/2021          )        Anesthesia Quick Evaluation

## 2021-05-18 NOTE — Anesthesia Postprocedure Evaluation (Signed)
Anesthesia Post Note  Patient: Erika Cardenas  Procedure(s) Performed: ESOPHAGOGASTRODUODENOSCOPY (EGD) WITH PROPOFOL BIOPSY     Patient location during evaluation: Endoscopy Anesthesia Type: MAC Level of consciousness: awake and alert Pain management: pain level controlled Vital Signs Assessment: post-procedure vital signs reviewed and stable Respiratory status: spontaneous breathing, nonlabored ventilation, respiratory function stable and patient connected to nasal cannula oxygen Cardiovascular status: stable and blood pressure returned to baseline Postop Assessment: no apparent nausea or vomiting Anesthetic complications: no   No notable events documented.  Last Vitals:  Vitals:   05/18/21 0929 05/18/21 0950  BP: 122/63 (!) 149/62  Pulse: (!) 54 (!) 54  Resp: 16 18  Temp:    SpO2: 100% 99%    Last Pain:  Vitals:   05/18/21 0950  TempSrc:   PainSc: 0-No pain                 March Rummage [place]

## 2021-05-18 NOTE — Op Note (Signed)
[facility] Patient Name: Erika Cardenas Procedure Date: 05/18/2021 MRN: [id number] Attending MD: Bernette Redbird , MD Date of Birth: 10/05/1928 CSN: [id number] Age: 87 Admit Type: Inpatient Procedure:                Upper GI endoscopy Indications:              Epigastric abdominal pain, Abnormal CT of the GI                            tract Providers:                Bernette Redbird, MD, Fransisca Connors, Harrington Challenger,                            Technician, Yevonne Pax, CRNA Referring MD:              Medicines:                Monitored Anesthesia Care Complications:            No immediate complications. Estimated Blood Loss:     Estimated blood loss was minimal. Procedure:                Pre-Anesthesia Assessment:                           - Prior to the procedure, a History and Physical                            was performed, and patient medications and                            allergies were reviewed. The patient's tolerance of                            previous anesthesia was also reviewed. The risks                            and benefits of the procedure and the sedation                            options and risks were discussed with the patient.                            All questions were answered, and informed consent                            was obtained. Prior Anticoagulants: The patient has                            taken no previous anticoagulant or antiplatelet                            agents. ASA Grade Assessment: III - A patient with  severe systemic disease. After reviewing the risks                            and benefits, the patient was deemed in                            satisfactory condition to undergo the procedure.                           After obtaining informed consent, the endoscope was                            passed under direct vision. Throughout the                            procedure, the  patient's blood pressure, pulse, and                            oxygen saturations were monitored continuously. The                            [id number]-[id number] ([id number]) was introduced through the                            mouth, and advanced to the fourth part of duodenum.                            The upper GI endoscopy was accomplished without                            difficulty. The patient tolerated the procedure                            well. Scope In: Scope Out: Findings:      The examined esophagus was normal.      A large hiatal hernia was present. LES at 31, hiatus at 40 cm (9 cm       hiatal hernia). No obvious paraesophageal component.      One non-bleeding cratered gastric ulcer with no stigmata of bleeding was       found on the anterior wall of the stomach. The lesion was 20 mm in       largest dimension. Biopsies were taken with a cold forceps for       histology. Estimated blood loss was minimal.      The exam of the stomach was otherwise normal.      The cardia and gastric fundus were normal on retroflexion.      The examined duodenum was normal. Impression:               - Normal esophagus.                           - Large hiatal hernia (9 cm). No obvious                            paraesophageal  component.                           - Non-bleeding gastric ulcer with no stigmata of                            bleeding. On anterior wall, near but not exactly                            at, the diaphragmatic hiatus. Unclear if this is a                            Cameron ulceration, although there is no history of                            exposure to ulcerogenic medications to otherwise                            explain it. The patient states she had an "ulcer"                            about 70 years ago but there is no history of                            ongoing or recurrent ulcer disease. Biopsied.                           - Normal examined duodenum. Moderate  Sedation:      This patient was sedated with monitored anesthesia care, not moderate       sedation. Recommendation:           - Await pathology results.                           - Do an upper GI series today. Procedure Code(s):        --- Professional ---                           [phone], Esophagogastroduodenoscopy, flexible,                            transoral; with biopsy, single or multiple Diagnosis Code(s):        --- Professional ---                           K44.9, Diaphragmatic hernia without obstruction or                            gangrene                           K25.9, Gastric ulcer, unspecified as acute or                            chronic, without hemorrhage or perforation  R10.13, Epigastric pain                           R93.3, Abnormal findings on diagnostic imaging of                            other parts of digestive tract CPT copyright 2019 [employer]. All rights reserved. The codes documented in this report are preliminary and upon coder review may  be revised to meet current compliance requirements. Bernette Redbird, MD 05/18/2021 9:48:10 AM This report has been signed electronically. Number of Addenda: 0

## 2021-05-18 NOTE — Interval H&P Note (Signed)
History and Physical Interval Note:  05/18/2021 9:02 AM  Erika Cardenas  has presented today for surgery, with the diagnosis of abnormal CT of stomach.  The various methods of treatment have been discussed with the patient and family. After consideration of risks, benefits and other options for treatment, the patient has consented to  Procedure(s): ESOPHAGOGASTRODUODENOSCOPY (EGD) WITH PROPOFOL (N/A) as a surgical intervention.  The patient's history has been reviewed, patient examined, no change in status, stable for surgery.  I have reviewed the patient's chart and labs.  Questions were answered to the patient's satisfaction.     Katy Fitch Augie Vane

## 2021-05-18 NOTE — Anesthesia Procedure Notes (Signed)
Procedure Name: MAC Date/Time: 05/18/2021 9:03 AM Performed by: Niel Hummer, CRNA Pre-anesthesia Checklist: Patient identified, Emergency Drugs available, Suction available and Patient being monitored Oxygen Delivery Method: Simple face mask

## 2021-05-18 NOTE — Plan of Care (Addendum)
Pt reports no acute distress or pain; able to ambulate to the bathroom with side assistance; slept comfortably through the night; consent for today's EGD explained and signed; bed at lowest position for safety and call light within reach.

## 2021-05-18 NOTE — Progress Notes (Signed)
PROGRESS NOTE    Erika Cardenas  TDD:[id number] DOB: [birth year] DOA: 05/17/2021 PCP: Pcp, No  Chief Complaint  Patient presents with   Abdominal Pain   Brief Narrative:  Erika Cardenas is Erika Cardenas 87 y.o. female with medical history significant for aortic stenosis s/p repair, htn, who presents with abdominal pain, nausea, vomiting.   She reports history of an ulcer when she was Erika Cardenas teenage, otherwise denies history gastric pathology. She denies melena or hematochezia. She takes Erika Cardenas daily aspirin but otherwise no nsaids. She is not Erika Cardenas drinker.   She reports one month of nausea and small-volume emesis (mainly mucous) after eating. Also has abdominal pain when this happens. When she doesn't eat she does not have abdominal pain or nausea/vomiting. No fevers, no cough. No recent hx of endoscopy.   Assessment & Plan:   Active Problems:   Gastric ulcer  # Gastric ulcer 1 month progressive nausea/vomiting and abd pain associated with eating. CT shows mucosal thickening, edema, and small loculations of extraluminal air about the gastric fundus concerning for gastric ulcer w/ microperforatioin.  - EGD with large hiatal hernia, nonbleeding gastric ulcer (biopsied) - planning for upper GI series - NPO, diet per GI  - PPI BID - GI consulted, appreciate recommendations - hold home asa - OF NOTE, THE PATIENT REQUESTS THAT WE DO NOT SHARE ANY BAD NEWS WITH HER. INSTEAD, SHE REQUESTS WE ADVISE HER NIECE, Erika Cardenas, WHO IS HER PRIMARY CONTACT   # Anemia - Hb downtrending today - repeat in afternoon - anemia labs - monitor   # Aortic stenosis S/p repair with bioprosthetic valve. No signs CHF Aspirin on hold    # HTN - cont home lisinopril, amlodipine, rosuvastatin   DVT prophylaxis: SCD Code Status: full Family Communication nieces at bedside x2 Disposition:   Status is: Inpatient  Remains inpatient appropriate because:Inpatient level of care appropriate due to severity of illness  Dispo: The patient is  from: Home              Anticipated d/c is to: Home              Patient currently is not medically stable to d/c.   Difficult to place patient No       Consultants:  GI  Procedures:  EGD - Normal esophagus. - Large hiatal hernia (9 cm). No obvious paraesophageal component. - Non-bleeding gastric ulcer with no stigmata of bleeding. On anterior wall, near but not exactly at, the diaphragmatic hiatus. Unclear if this is Erika Cardenas ulceration, although there is no history of exposure to ulcerogenic medications to otherwise explain it. The patient states she had an "ulcer" about 70 years ago but there is no history of ongoing or recurrent ulcer disease. Biopsied. - Normal examined duodenum.  - Await pathology results. - Do an upper GI series today.  Antimicrobials:  Anti-infectives (From admission, onward)    None          Subjective: No new complaints  Objective: Vitals:   05/18/21 0817 05/18/21 0928 05/18/21 0929 05/18/21 0950  BP: (!) 160/58 (!) 121/51 122/63 (!) 149/62  Pulse: 65 65 (!) 54 (!) 54  Resp: 16 16 16 18   Temp: 98.1 F (36.7 C) 97.7 F (36.5 C)    TempSrc: Oral Oral    SpO2: 100% 100% 100% 99%  Weight: 60.2 kg     Height: 5' (1.524 m)       Intake/Output Summary (Last 24 hours) at 05/18/2021 1039 Last data filed at  05/18/2021 0925 Gross per 24 hour  Intake 200 ml  Output 600 ml  Net -400 ml   Filed Weights   05/17/21 0927 05/18/21 0817  Weight: 60.2 kg 60.2 kg    Examination:  General exam: Appears calm and comfortable, pleasant Respiratory system: Clear to auscultation. Respiratory effort normal. Cardiovascular system: S1 & S2 heard, RRR.  Gastrointestinal system: Abdomen is nondistended, soft and nontender. Central nervous system: Alert and oriented. No focal neurological deficits. Extremities: no LEE Skin: No rashes, lesions or ulcers Psychiatry: Judgement and insight appear normal. Mood & affect appropriate.     Data Reviewed:  I have personally reviewed following labs and imaging studies  CBC: Recent Labs  Lab 05/17/21 0953 05/18/21 0608  WBC 6.2 4.6  NEUTROABS 4.8  --   HGB 8.5* 7.4*  HCT 27.2* 25.0*  MCV 74.7* 75.8*  PLT 336 265    Basic Metabolic Panel: Recent Labs  Lab 05/17/21 0953 05/18/21 0608  NA 136 138  K 3.9 3.8  CL 103 107  CO2 22 22  GLUCOSE 121* 93  BUN 16 13  CREATININE 0.95 0.89  CALCIUM 9.5 9.0    GFR: Estimated Creatinine Clearance: 34.1 mL/min (by C-G formula based on SCr of 0.89 mg/dL).  Liver Function Tests: Recent Labs  Lab 05/17/21 0953  AST 17  ALT 13  ALKPHOS 67  BILITOT 0.4  PROT 7.4  ALBUMIN 3.9    CBG: No results for input(s): GLUCAP in the last 168 hours.   Recent Results (from the past 240 hour(s))  Resp Panel by RT-PCR (Flu Erika Cardenas&B, Covid) Nasopharyngeal Swab     Status: None   Collection Time: 05/17/21 12:35 PM   Specimen: Nasopharyngeal Swab; Nasopharyngeal(NP) swabs in vial transport medium  Result Value Ref Range Status   SARS Coronavirus 2 by RT PCR NEGATIVE NEGATIVE Final    Comment: (NOTE) SARS-CoV-2 target nucleic acids are NOT DETECTED.  The SARS-CoV-2 RNA is generally detectable in upper respiratory specimens during the acute phase of infection. The lowest concentration of SARS-CoV-2 viral copies this assay can detect is 138 copies/mL. Erika Cardenas negative result does not preclude SARS-Cov-2 infection and should not be used as the sole basis for treatment or other patient management decisions. Erika Cardenas negative result may occur with  improper specimen collection/handling, submission of specimen other than nasopharyngeal swab, presence of viral mutation(s) within the areas targeted by this assay, and inadequate number of viral copies(<138 copies/mL). Erika Cardenas negative result must be combined with clinical observations, patient history, and epidemiological information. The expected result is Negative.  Fact Sheet for Patients:   BloggerCourse.com  Fact Sheet for Healthcare Providers:  SeriousBroker.it  This test is no t yet approved or cleared by the [place] FDA and  has been authorized for detection and/or diagnosis of SARS-CoV-2 by FDA under an Emergency Use Authorization (EUA). This EUA will remain  in effect (meaning this test can be used) for the duration of the COVID-19 declaration under Section 564(b)(1) of the Act, 21 U.S.C.section 360bbb-3(b)(1), unless the authorization is terminated  or revoked sooner.       Influenza Jenell Dobransky by PCR NEGATIVE NEGATIVE Final   Influenza B by PCR NEGATIVE NEGATIVE Final    Comment: (NOTE) The Xpert Xpress SARS-CoV-2/FLU/RSV plus assay is intended as an aid in the diagnosis of influenza from Nasopharyngeal swab specimens and should not be used as Avion Kutzer sole basis for treatment. Nasal washings and aspirates are unacceptable for Xpert Xpress SARS-CoV-2/FLU/RSV testing.  Fact Sheet for Patients:  BloggerCourse.com  Fact Sheet for Healthcare Providers: SeriousBroker.it  This test is not yet approved or cleared by the [place] FDA and has been authorized for detection and/or diagnosis of SARS-CoV-2 by FDA under an Emergency Use Authorization (EUA). This EUA will remain in effect (meaning this test can be used) for the duration of the COVID-19 declaration under Section 564(b)(1) of the Act, 21 U.S.C. section 360bbb-3(b)(1), unless the authorization is terminated or revoked.  Performed at [facility], [address] [place], [place] [address]          Radiology Studies: CT ABDOMEN PELVIS W CONTRAST  Result Date: 05/17/2021 CLINICAL DATA:  Nausea, vomiting, epigastric pain for 4 weeks, back pain, weight loss EXAM: CT ABDOMEN AND PELVIS WITH CONTRAST TECHNIQUE: Multidetector CT imaging of the abdomen and pelvis was performed using the standard  protocol following bolus administration of intravenous contrast. CONTRAST:  OMNIPAQUE IOHEXOL 300 MG/ML  SOLN COMPARISON:  None. FINDINGS: Lower chest: No acute abnormality. Moderate hiatal hernia, incompletely imaged, with intrathoracic position of the gastric fundus. Hepatobiliary: No solid liver abnormality is seen. No gallstones, gallbladder wall thickening, or biliary dilatation. Pancreas: Unremarkable. No pancreatic ductal dilatation or surrounding inflammatory changes. Spleen: Normal in size without significant abnormality. Adrenals/Urinary Tract: Adrenal glands are unremarkable. Kidneys are normal, without renal calculi, solid lesion, or hydronephrosis. Bladder is unremarkable. Stomach/Bowel: There is mucosal thickening, edema, and small loculations of extraluminal air about the gastric fundus at the level of the diaphragmatic hiatus (series 2, image 9). Appendix appears normal. No evidence of bowel wall thickening, distention, or inflammatory changes. Pancolonic diverticulosis. Vascular/Lymphatic: Aortic atherosclerosis. No enlarged abdominal or pelvic lymph nodes. Reproductive: No mass or other significant abnormality. Other: No abdominal wall hernia or abnormality. No abdominopelvic ascites. Musculoskeletal: No acute or significant osseous findings. IMPRESSION: 1. There is mucosal thickening, edema, and small loculations of extraluminal air about the gastric fundus at the level of the diaphragmatic hiatus. Findings are of concerning for gastric ulcer complicated by microperforation. Underlying malignancy is Cyriah Childrey significant differential consideration. 2. Moderate hiatal hernia, incompletely imaged, with intrathoracic position of the gastric fundus. 3. Pancolonic diverticulosis without evidence of acute diverticulitis. Aortic Atherosclerosis (ICD10-I70.0). Electronically Signed   By: Lauralyn Primes M.D.   On: 05/17/2021 11:32        Scheduled Meds:  amLODipine  5 mg Oral Daily   lisinopril  40 mg  Oral Daily   pantoprazole (PROTONIX) IV  40 mg Intravenous Q12H   rosuvastatin  10 mg Oral Daily   sodium chloride flush  3 mL Intravenous Q12H   Continuous Infusions:  sodium chloride       LOS: 1 day    Time spent: over 30 min    Lacretia Nicks, MD [facility]   To contact the attending provider between 7A-7P or the covering provider during after hours 7P-7A, please log into the web site [email or web address] and access using universal [place] password for that web site. If you do not have the password, please call the hospital operator.  05/18/2021, 10:39 AM

## 2021-05-18 NOTE — Consult Note (Addendum)
[facility] Consult [facility]  Erika NanasCarola Urda 03/18/1928  [id number].    Requesting MD: Erika HughsBuccini, MD Chief Complaint/Reason for Consult: Hiatal hernia, gastric ulcer  HPI:  Ms. Erika Cardenas is a 87 y/o F with a PMH HTN, trigeminal neuralgia, and aortic valve replacement who presented to the ED 6/20 with a cc 4-5 weeks of nausea, post-prandial emesis, and epigastric pain. States about 4-5 weeks ago after she ate the same breakfast she eats every day (oats, berries) she had nausea and threw up a small amount of mucous, not food. Patient reports similar instances ongoing for 4-5 weeks, followed by epigastric pain that radiated to her back. Denies a previous history of similar sxs. Denies constipation or blood in her stool. At baseline she lives with her niece, is independent of ADLs, does not drive. Denies a history of MI, CVA, or lung disease. Reports she quit smoking over 40 years ago. Denies alcohol or drug use. Takes baby ASA daily.   Workup included CT A/P which was concerning for gastric ulcer near the fundus with possible microperforation (small loculations extraluminal air about the fundus near the hiatus), along with hiatal hernia and pancolonic diverticulosis. GI was consulted and the patient underwent EGD where hiatal hernia and gastric ulcer were noted. CCS has been as ked to consult.    ROS: Review of Systems  Constitutional: Negative.   HENT: Negative.    Eyes: Negative.   Respiratory: Negative.    Cardiovascular: Negative.   Gastrointestinal:  Positive for abdominal pain, nausea and vomiting. Negative for blood in stool, constipation, diarrhea and melena.  Genitourinary:  Positive for urgency.  Musculoskeletal: Negative.   Skin: Negative.   Neurological: Negative.   Endo/Heme/Allergies: Negative.   Psychiatric/Behavioral: Negative.     History reviewed. No pertinent family history.  Past Medical History:  Diagnosis Date   Arthritis    Coronary artery disease     Hypertension     Past Surgical History:  Procedure Laterality Date   KNEE Cardenas     VALVE REPLACEMENT      Social History:  reports that she has never smoked. She has never used smokeless tobacco. She reports that she does not drink alcohol and does not use drugs.  Allergies:  Allergies  Allergen Reactions   Lactose Intolerance (Gi) Other (See Comments)    Upset stomach   Coffea Arabica Nausea Only   Lac Bovis Diarrhea    Medications Prior to Admission  Medication Sig Dispense Refill   amLODipine (NORVASC) 5 MG tablet Take 5 mg by mouth daily.     aspirin EC 81 MG tablet Take 81 mg by mouth daily. Swallow whole.     Cholecalciferol 50 MCG (2000 UT) CAPS Take 2,000 Units by mouth daily.     Cyanocobalamin 2000 MCG TBCR Take 2,000 mcg by mouth daily.     Flaxseed, Linseed, (FLAX SEED OIL) 1000 MG CAPS Take 1,000 mg by mouth daily.     lisinopril (ZESTRIL) 40 MG tablet Take 40 mg by mouth daily.     rosuvastatin (CRESTOR) 10 MG tablet Take 10 mg by mouth daily.      Blood pressure (!) 149/62, pulse (!) 54, temperature 97.7 F (36.5 C), temperature source Oral, resp. rate 18, height 5' (1.524 m), weight 60.2 kg, SpO2 99 %. Physical Exam: Constitutional: NAD; conversant; no deformities Eyes: Moist conjunctiva; no lid lag; anicteric; PERRL Neck: Trachea midline; no thyromegaly Lungs: Normal respiratory effort; no tactile fremitus CV: RRR; no palpable thrills; no pitting  edema GI: Abd soft, non-tender, non-distended, no palpable hepatosplenomegaly MSK: symmetrical, no clubbing/cyanosis Psychiatric: Appropriate affect; alert and oriented x3 Lymphatic: No palpable cervical or axillary lymphadenopathy  Results for orders placed or performed during the hospital encounter of 05/17/21 (from the past 48 hour(s))  Comprehensive metabolic panel     Status: Abnormal   Collection Time: 05/17/21  9:53 AM  Result Value Ref Range   Sodium 136 135 - 145 mmol/L   Potassium 3.9 3.5 - 5.1  mmol/L   Chloride 103 98 - 111 mmol/L   CO2 22 22 - 32 mmol/L   Glucose, Bld 121 (H) 70 - 99 mg/dL    Comment: Glucose reference range applies only to samples taken after fasting for at least 8 hours.   BUN 16 8 - 23 mg/dL   Creatinine, Ser 8.85 0.44 - 1.00 mg/dL   Calcium 9.5 8.9 - 02.7 mg/dL   Total Protein 7.4 6.5 - 8.1 g/dL   Albumin 3.9 3.5 - 5.0 g/dL   AST 17 15 - 41 U/L   ALT 13 0 - 44 U/L   Alkaline Phosphatase 67 38 - 126 U/L   Total Bilirubin 0.4 0.3 - 1.2 mg/dL   GFR, Estimated 57 (L) >60 mL/min    Comment: (NOTE) Calculated using the CKD-EPI Creatinine Equation (2021)    Anion gap 11 5 - 15    Comment: Performed at [facility], [address] [place], [place] [address]  Lipase, blood     Status: None   Collection Time: 05/17/21  9:53 AM  Result Value Ref Range   Lipase 27 11 - 51 U/L    Comment: Performed at [facility], [address] [place], [place] [address]  CBC with Differential     Status: Abnormal   Collection Time: 05/17/21  9:53 AM  Result Value Ref Range   WBC 6.2 4.0 - 10.5 K/uL   RBC 3.64 (L) 3.87 - 5.11 MIL/uL   Hemoglobin 8.5 (L) 12.0 - 15.0 g/dL    Comment: Reticulocyte Hemoglobin testing may be clinically indicated, consider ordering this additional test [id number]    HCT 27.2 (L) 36.0 - 46.0 %   MCV 74.7 (L) 80.0 - 100.0 fL   MCH 23.4 (L) 26.0 - 34.0 pg   MCHC 31.3 30.0 - 36.0 g/dL   RDW 62.8 (H) 36.6 - 29.4 %   Platelets 336 150 - 400 K/uL   nRBC 0.0 0.0 - 0.2 %   Neutrophils Relative % 77 %   Neutro Abs 4.8 1.7 - 7.7 K/uL   Lymphocytes Relative 13 %   Lymphs Abs 0.8 0.7 - 4.0 K/uL   Monocytes Relative 7 %   Monocytes Absolute 0.5 0.1 - 1.0 K/uL   Eosinophils Relative 1 %   Eosinophils Absolute 0.0 0.0 - 0.5 K/uL   Basophils Relative 1 %   Basophils Absolute 0.1 0.0 - 0.1 K/uL   WBC Morphology MORPHOLOGY UNREMARKABLE    Smear Review Normal platelet morphology    Immature Granulocytes 1 %   Abs Immature  Granulocytes 0.03 0.00 - 0.07 K/uL   Spherocytes PRESENT     Comment: Performed at [facility], [address] [place], [place] [address]  Occult blood card to lab, stool     Status: None   Collection Time: 05/17/21 11:57 AM  Result Value Ref Range   Fecal Occult Bld NEGATIVE NEGATIVE    Comment: Performed at [facility]  [address] [place], [place] [address]  Resp Panel by RT-PCR (Flu A&B, Covid) Nasopharyngeal Swab     Status: None   Collection Time: 05/17/21 12:35 PM   Specimen: Nasopharyngeal Swab; Nasopharyngeal(NP) swabs in vial transport medium  Result Value Ref Range   SARS Coronavirus 2 by RT PCR NEGATIVE NEGATIVE    Comment: (NOTE) SARS-CoV-2 target nucleic acids are NOT DETECTED.  The SARS-CoV-2 RNA is generally detectable in upper respiratory specimens during the acute phase of infection. The lowest concentration of SARS-CoV-2 viral copies this assay can detect is 138 copies/mL. A negative result does not preclude SARS-Cov-2 infection and should not be used as the sole basis for treatment or other patient management decisions. A negative result may occur with  improper specimen collection/handling, submission of specimen other than nasopharyngeal swab, presence of viral mutation(s) within the areas targeted by this assay, and inadequate number of viral copies(<138 copies/mL). A negative result must be combined with clinical observations, patient history, and epidemiological information. The expected result is Negative.  Fact Sheet for Patients:  BloggerCourse.com  Fact Sheet for Healthcare Providers:  SeriousBroker.it  This test is no t yet approved or cleared by the [place] FDA and  has been authorized for detection and/or diagnosis of SARS-CoV-2 by FDA under an Emergency Use Authorization (EUA). This EUA will remain  in effect (meaning this test can be used) for the duration of  the COVID-19 declaration under Section 564(b)(1) of the Act, 21 U.S.C.section 360bbb-3(b)(1), unless the authorization is terminated  or revoked sooner.       Influenza A by PCR NEGATIVE NEGATIVE   Influenza B by PCR NEGATIVE NEGATIVE    Comment: (NOTE) The Xpert Xpress SARS-CoV-2/FLU/RSV plus assay is intended as an aid in the diagnosis of influenza from Nasopharyngeal swab specimens and should not be used as a sole basis for treatment. Nasal washings and aspirates are unacceptable for Xpert Xpress SARS-CoV-2/FLU/RSV testing.  Fact Sheet for Patients: BloggerCourse.com  Fact Sheet for Healthcare Providers: SeriousBroker.it  This test is not yet approved or cleared by the [place] FDA and has been authorized for detection and/or diagnosis of SARS-CoV-2 by FDA under an Emergency Use Authorization (EUA). This EUA will remain in effect (meaning this test can be used) for the duration of the COVID-19 declaration under Section 564(b)(1) of the Act, 21 U.S.C. section 360bbb-3(b)(1), unless the authorization is terminated or revoked.  Performed at [facility], [address] [place], [place] [address]   Urinalysis, Routine w reflex microscopic Urine, Clean Catch     Status: Abnormal   Collection Time: 05/17/21  4:17 PM  Result Value Ref Range   Color, Urine YELLOW YELLOW   APPearance CLEAR CLEAR   Specific Gravity, Urine >1.046 (H) 1.005 - 1.030   pH 5.0 5.0 - 8.0   Glucose, UA NEGATIVE NEGATIVE mg/dL   Hgb urine dipstick NEGATIVE NEGATIVE   Bilirubin Urine NEGATIVE NEGATIVE   Ketones, ur 20 (A) NEGATIVE mg/dL   Protein, ur NEGATIVE NEGATIVE mg/dL   Nitrite NEGATIVE NEGATIVE   Leukocytes,Ua NEGATIVE NEGATIVE    Comment: Performed at [facility], [address] [place], [place] [address]  Basic metabolic panel     Status: None   Collection Time: 05/18/21  6:08 AM  Result Value Ref Range    Sodium 138 135 - 145 mmol/L   Potassium 3.8 3.5 - 5.1 mmol/L   Chloride 107 98 - 111 mmol/L   CO2  22 22 - 32 mmol/L   Glucose, Bld 93 70 - 99 mg/dL    Comment: Glucose reference range applies only to samples taken after fasting for at least 8 hours.   BUN 13 8 - 23 mg/dL   Creatinine, Ser 4.40 0.44 - 1.00 mg/dL   Calcium 9.0 8.9 - 34.7 mg/dL   GFR, Estimated >42 >59 mL/min    Comment: (NOTE) Calculated using the CKD-EPI Creatinine Equation (2021)    Anion gap 9 5 - 15    Comment: Performed at [facility], [address], [place], [place] [address]  CBC     Status: Abnormal   Collection Time: 05/18/21  6:08 AM  Result Value Ref Range   WBC 4.6 4.0 - 10.5 K/uL   RBC 3.30 (L) 3.87 - 5.11 MIL/uL   Hemoglobin 7.4 (L) 12.0 - 15.0 g/dL    Comment: Reticulocyte Hemoglobin testing may be clinically indicated, consider ordering this additional test [id number]    HCT 25.0 (L) 36.0 - 46.0 %   MCV 75.8 (L) 80.0 - 100.0 fL   MCH 22.4 (L) 26.0 - 34.0 pg   MCHC 29.6 (L) 30.0 - 36.0 g/dL   RDW 18.8 (H) 41.6 - 60.6 %   Platelets 265 150 - 400 K/uL   nRBC 0.0 0.0 - 0.2 %    Comment: Performed at [facility], [address] [place], [place] [address]   CT ABDOMEN PELVIS W CONTRAST  Result Date: 05/17/2021 CLINICAL DATA:  Nausea, vomiting, epigastric pain for 4 weeks, back pain, weight loss EXAM: CT ABDOMEN AND PELVIS WITH CONTRAST TECHNIQUE: Multidetector CT imaging of the abdomen and pelvis was performed using the standard protocol following bolus administration of intravenous contrast. CONTRAST:  OMNIPAQUE IOHEXOL 300 MG/ML  SOLN COMPARISON:  None. FINDINGS: Lower chest: No acute abnormality. Moderate hiatal hernia, incompletely imaged, with intrathoracic position of the gastric fundus. Hepatobiliary: No solid liver abnormality is seen. No gallstones, gallbladder wall thickening, or biliary dilatation. Pancreas: Unremarkable. No pancreatic ductal  dilatation or surrounding inflammatory changes. Spleen: Normal in size without significant abnormality. Adrenals/Urinary Tract: Adrenal glands are unremarkable. Kidneys are normal, without renal calculi, solid lesion, or hydronephrosis. Bladder is unremarkable. Stomach/Bowel: There is mucosal thickening, edema, and small loculations of extraluminal air about the gastric fundus at the level of the diaphragmatic hiatus (series 2, image 9). Appendix appears normal. No evidence of bowel wall thickening, distention, or inflammatory changes. Pancolonic diverticulosis. Vascular/Lymphatic: Aortic atherosclerosis. No enlarged abdominal or pelvic lymph nodes. Reproductive: No mass or other significant abnormality. Other: No abdominal wall hernia or abnormality. No abdominopelvic ascites. Musculoskeletal: No acute or significant osseous findings. IMPRESSION: 1. There is mucosal thickening, edema, and small loculations of extraluminal air about the gastric fundus at the level of the diaphragmatic hiatus. Findings are of concerning for gastric ulcer complicated by microperforation. Underlying malignancy is a significant differential consideration. 2. Moderate hiatal hernia, incompletely imaged, with intrathoracic position of the gastric fundus. 3. Pancolonic diverticulosis without evidence of acute diverticulitis. Aortic Atherosclerosis (ICD10-I70.0). Electronically Signed   By: Lauralyn Primes M.D.   On: 05/17/2021 11:32   DG UGI W SINGLE CM (SOL OR THIN BA)  Result Date: 05/18/2021 CLINICAL DATA:  86 year old female with history of hiatal hernia. EXAM: UPPER GI SERIES WITH KUB TECHNIQUE: After obtaining a scout radiograph a routine upper GI series was performed using thin and high density barium. FLUOROSCOPY TIME:  Fluoroscopy Time:  2 minutes and 18 seconds Radiation Exposure Index (  if provided by the fluoroscopic device): 20.6 mGy COMPARISON:  None. FINDINGS: Preprocedural KUB demonstrates a nonobstructive bowel gas  pattern, with no pneumoperitoneum. Subsequently, a limited single contrast imaging was performed. This demonstrated a mildly patulous esophagus with intermittent failure to fully propagate primary peristaltic waves. Tertiary contractions were also noted. Moderate-sized hiatal hernia. Single contrast images of the stomach demonstrate what appears to be a shallow ulcer along the right-side of the lesser curvature of the stomach immediately distal to the esophageal hiatus (best appreciated on image 31 of series 7). The remainder of the stomach was otherwise grossly unremarkable in appearance on today's single contrast examination. Duodenal bulb was normal in appearance. IMPRESSION: 1. Shallow ulcer in the lesser curvature of the stomach immediately distal to the esophageal hiatus. 2. Moderate-sized hiatal hernia. 3. 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 Electronically Signed   By: Trudie Reed M.D.   On: 05/18/2021 12:08      Assessment/Plan HTN PMH aortic valve replacement 2007  ?esophageal dysmotility   Nausea, post-prandial small volume emesis Hiatal hernia without obstruction Non-bleeding gastric ulcer  - EGD 6/21 by Dr. Matthias Hughs w/ non-bleeding gastric ulcer in anterior wall of the stomach, Bx pending  - UGI 6/21 pre-procedure KUB with non-obstructive bowel gas pattern. UGI with patulous esophagus with dysmotility, shallow ulcer just distal to esophagus along lesser curvature of the stomach, stomach and duodenum were otherwise normal in appearance  - continue BID Protonix, add Carafate suspension TID for medical treatment of gastric ulcer. - No signs of complete obstruction of hiatal hernia. monitor toleration of diet post-procedures today and attempt non-operative management. Patient history of PO intolerance/pain could be combination of esophageal dysmotility, acute pain from the ulceration, and discomfort from her hiatal hernia which has likely been present much  longer than 4-5 weeks.  - CCS will follow, recommend SOFT diet and will add supplemental protein shakes (ensure TID)  Adam Phenix, [facility] Cardenas Please see Amion for pager number during day hours 7:00am-4:30pm 05/18/2021, 1:47 PM

## 2021-05-18 NOTE — Transfer of Care (Signed)
Immediate Anesthesia Transfer of Care Note  Patient: Erika Cardenas  Procedure(s) Performed: ESOPHAGOGASTRODUODENOSCOPY (EGD) WITH PROPOFOL BIOPSY  Patient Location: PACU  Anesthesia Type:MAC  Level of Consciousness: awake, alert  and oriented  Airway & Oxygen Therapy: Patient Spontanous Breathing and Patient connected to face mask oxygen  Post-op Assessment: Report given to RN, Post -op Vital signs reviewed and stable and Patient moving all extremities X 4  Post vital signs: Reviewed and stable  Last Vitals:  Vitals Value Taken Time  BP 127/51   Temp    Pulse 60   Resp 12   SpO2 100     Last Pain:  Vitals:   05/18/21 0817  TempSrc: Oral  PainSc: 0-No pain         Complications: No notable events documented.

## 2021-05-19 DIAGNOSIS — K449 Diaphragmatic hernia without obstruction or gangrene: Secondary | ICD-10-CM

## 2021-05-19 DIAGNOSIS — K251 Acute gastric ulcer with perforation: Principal | ICD-10-CM

## 2021-05-19 LAB — COMPREHENSIVE METABOLIC PANEL
ALT: 11 U/L (ref 0–44)
AST: 15 U/L (ref 15–41)
Albumin: 3.2 g/dL — ABNORMAL LOW (ref 3.5–5.0)
Alkaline Phosphatase: 56 U/L (ref 38–126)
Anion gap: 5 (ref 5–15)
BUN: 13 mg/dL (ref 8–23)
CO2: 26 mmol/L (ref 22–32)
Calcium: 8.5 mg/dL — ABNORMAL LOW (ref 8.9–10.3)
Chloride: 107 mmol/L (ref 98–111)
Creatinine, Ser: 1 mg/dL (ref 0.44–1.00)
GFR, Estimated: 54 mL/min — ABNORMAL LOW (ref >60)
Glucose, Bld: 107 mg/dL — ABNORMAL HIGH (ref 70–99)
Potassium: 3.8 mmol/L (ref 3.5–5.1)
Sodium: 138 mmol/L (ref 135–145)
Total Bilirubin: 0.4 mg/dL (ref 0.3–1.2)
Total Protein: 5.8 g/dL — ABNORMAL LOW (ref 6.5–8.1)

## 2021-05-19 LAB — CBC WITH DIFFERENTIAL/PLATELET
Abs Immature Granulocytes: 0.02 10*3/uL (ref 0.00–0.07)
Basophils Absolute: 0 10*3/uL (ref 0.0–0.1)
Basophils Relative: 1 %
Eosinophils Absolute: 0.1 10*3/uL (ref 0.0–0.5)
Eosinophils Relative: 3 %
HCT: 22.3 % — ABNORMAL LOW (ref 36.0–46.0)
Hemoglobin: 7.1 g/dL — ABNORMAL LOW (ref 12.0–15.0)
Immature Granulocytes: 0 %
Lymphocytes Relative: 22 %
Lymphs Abs: 1 10*3/uL (ref 0.7–4.0)
MCH: 25.2 pg — ABNORMAL LOW (ref 26.0–34.0)
MCHC: 31.8 g/dL (ref 30.0–36.0)
MCV: 79.1 fL — ABNORMAL LOW (ref 80.0–100.0)
Monocytes Absolute: 0.5 10*3/uL (ref 0.1–1.0)
Monocytes Relative: 10 %
Neutro Abs: 3.1 10*3/uL (ref 1.7–7.7)
Neutrophils Relative %: 64 %
Platelets: 263 10*3/uL (ref 150–400)
RBC: 2.82 MIL/uL — ABNORMAL LOW (ref 3.87–5.11)
RDW: 23.7 % — ABNORMAL HIGH (ref 11.5–15.5)
WBC: 4.7 10*3/uL (ref 4.0–10.5)
nRBC: 0 % (ref 0.0–0.2)

## 2021-05-19 LAB — SURGICAL PATHOLOGY

## 2021-05-19 LAB — IRON AND TIBC
Iron: 16 ug/dL — ABNORMAL LOW (ref 28–170)
Saturation Ratios: 5 % — ABNORMAL LOW (ref 10.4–31.8)
TIBC: 320 ug/dL (ref 250–450)
UIBC: 304 ug/dL

## 2021-05-19 LAB — MAGNESIUM: Magnesium: 2.1 mg/dL (ref 1.7–2.4)

## 2021-05-19 LAB — FERRITIN: Ferritin: 6 ng/mL — ABNORMAL LOW (ref 11–307)

## 2021-05-19 LAB — PREPARE RBC (CROSSMATCH)

## 2021-05-19 LAB — PHOSPHORUS: Phosphorus: 4 mg/dL (ref 2.5–4.6)

## 2021-05-19 MED ORDER — PANTOPRAZOLE SODIUM 40 MG PO TBEC
40.0000 mg | DELAYED_RELEASE_TABLET | Freq: Two times a day (BID) | ORAL | Status: DC
  Administered 2021-05-19 – 2021-05-20 (×3): 40 mg via ORAL
  Filled 2021-05-19 (×3): qty 1

## 2021-05-19 MED ORDER — SODIUM CHLORIDE 0.9 % IV SOLN
510.0000 mg | Freq: Once | INTRAVENOUS | Status: AC
  Administered 2021-05-19: 510 mg via INTRAVENOUS
  Filled 2021-05-19: qty 510

## 2021-05-19 MED ORDER — CLARITHROMYCIN 250 MG PO TABS
500.0000 mg | ORAL_TABLET | Freq: Two times a day (BID) | ORAL | Status: DC
  Administered 2021-05-19 – 2021-05-20 (×3): 500 mg via ORAL
  Filled 2021-05-19 (×3): qty 2

## 2021-05-19 MED ORDER — SODIUM CHLORIDE 0.9% IV SOLUTION: Freq: Once | INTRAVENOUS | Status: AC

## 2021-05-19 MED ORDER — AMOXICILLIN 250 MG PO CAPS
1000.0000 mg | ORAL_CAPSULE | Freq: Two times a day (BID) | ORAL | Status: DC
  Administered 2021-05-19 – 2021-05-20 (×3): 1000 mg via ORAL
  Filled 2021-05-19 (×3): qty 4

## 2021-05-19 MED ORDER — PANTOPRAZOLE SODIUM 40 MG PO TBEC: 40.0000 mg | DELAYED_RELEASE_TABLET | Freq: Every day | ORAL | Status: DC

## 2021-05-19 MED ORDER — SACCHAROMYCES BOULARDII 250 MG PO CAPS
250.0000 mg | ORAL_CAPSULE | Freq: Two times a day (BID) | ORAL | Status: DC
  Administered 2021-05-19 – 2021-05-20 (×3): 250 mg via ORAL
  Filled 2021-05-19 (×3): qty 1

## 2021-05-19 MED ORDER — POLYETHYLENE GLYCOL 3350 17 G PO PACK
17.0000 g | PACK | Freq: Every day | ORAL | Status: DC | PRN
  Administered 2021-05-19: 17 g via ORAL
  Filled 2021-05-19: qty 1

## 2021-05-19 NOTE — Progress Notes (Signed)
Patient ID: Sammye Staff, female   DOB: Apr 23, 1928, 87 y.o.   MRN: [id number]   Acute Care Surgery Service Progress Note:    Chief Complaint/Subjective: No n/v Friend at [facility] Had some scrambled eggs, a few potatoes and chewed thoroughly Working on protein shake Did feel some bubbling in stomach but no pain  Objective: Vital signs in last 24 hours: Temp:  [97.9 F (36.6 C)-98.4 F (36.9 C)] 97.9 F (36.6 C) (06/22 0431) Pulse Rate:  [60-72] 72 (06/22 0431) Resp:  [16-18] 16 (06/22 0431) BP: (107-139)/(40-62) 132/62 (06/22 0431) SpO2:  [98 %-100 %] 100 % (06/22 0431) Last BM Date: 05/18/21  Intake/Output from previous day: 06/21 0701 - 06/22 0700 In: 443 [P.O.:240; I.V.:203] Out: 300 [Urine:300] Intake/Output this shift: No intake/output data recorded.  Lungs: cta, nonlabored  Cardiovascular: reg  Abd: soft, nt, nd  Extremities: no edema, +SCDs  Neuro: alert, nonfocal  Lab Results: CBC  Recent Labs    05/18/21 0608 05/18/21 1600 05/19/21 0527  WBC 4.6  --  4.7  HGB 7.4* 7.6* 7.1*  HCT 25.0* 26.8* 22.3*  PLT 265  --  263   BMET Recent Labs    05/18/21 0608 05/19/21 0527  NA 138 138  K 3.8 3.8  CL 107 107  CO2 22 26  GLUCOSE 93 107*  BUN 13 13  CREATININE 0.89 1.00  CALCIUM 9.0 8.5*   LFT Hepatic Function Latest Ref Rng & Units 05/19/2021 05/17/2021  Total Protein 6.5 - 8.1 g/dL 5.8(L) 7.4  Albumin 3.5 - 5.0 g/dL 3.2(L) 3.9  AST 15 - 41 U/L 15 17  ALT 0 - 44 U/L 11 13  Alk Phosphatase 38 - 126 U/L 56 67  Total Bilirubin 0.3 - 1.2 mg/dL 0.4 0.4   PT/INR No results for input(s): LABPROT, INR in the last 72 hours. ABG No results for input(s): PHART, HCO3 in the last 72 hours.  Invalid input(s): PCO2, PO2  Studies/Results:  Anti-infectives: Anti-infectives (From admission, onward)    None       Medications: Scheduled Meds:  amLODipine  5 mg Oral Daily   feeding supplement  1 Container Oral TID BM   lisinopril  40 mg Oral Daily    pantoprazole  40 mg Oral BID   rosuvastatin  10 mg Oral Daily   sodium chloride flush  3 mL Intravenous Q12H   sucralfate  1 g Oral TID WC & HS   Continuous Infusions:  sodium chloride     PRN Meds:.sodium chloride, ondansetron, sodium chloride flush  Assessment/Plan: Patient Active Problem List   Diagnosis Date Noted   Gastric ulcer 05/17/2021   Trigeminal neuralgia 12/06/2018   Aortic stenosis 04/27/2016   Essential hypertension 04/27/2016   s/p Procedure(s): ESOPHAGOGASTRODUODENOSCOPY (EGD) WITH PROPOFOL BIOPSY 05/18/2021  HTN AVR 2007 Large hiatal hernia without obstruction Nonbleeding gastric ulcer anemia  Cont bid ppi and liquid carafate ac/hs for ulcer Hgb stable Consider iron supplement or equivalent for anemia Cont diet as tolerated with protein shake supplements & small meals  Disposition: rec keeping today to ensure tolerating small meals/shakes. Hopefully can avoid discussing/needing hiatal hernia repair   LOS: 2 days    Leighton Ruff. Redmond Pulling, MD, FACS General, Bariatric, & Minimally Invasive Surgery [phone] [facility]

## 2021-05-19 NOTE — Progress Notes (Signed)
[facility]  PROGRESS NOTE  Erika Cardenas CBJ:[id number] DOB: 1928-08-06 DOA: 05/17/2021 PCP: Pcp, No   Brief HPI:   87 year old female with medical history of aortic stenosis s/p aortic valve replacement, hypertension, trigeminal neuralgia presented with abdominal pain, nausea and vomiting.  She has history of gastric ulcer since she was a teenager.  No recent endoscopy.  Imaging in the ED showed possible gastric ulcer with microperforation.  GI was consulted.    Subjective   Patient seen and examined, denies vomiting.  EGD on 05/18/2021 showed large hiatal hernia no paraesophageal component.  Nonbleeding gastric ulcer with no stigmata of bleeding.  General surgery was consulted.   Assessment/Plan:     Gastric ulcer -EGD showed large hiatal hernia, nonbleeding gastric ulcer which was biopsied; biopsy showed chronic active gastritis with intestinal metaplasia and Helicobacter pylori -Upper GI series showed shallow ulcer in the lesser curvature of stomach immediately distal to the esophageal hiatus.  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 -Continue PPI twice daily -Patient started on clarithromycin, amoxicillin, pantoprazole for treatment of H. Pylori   Hiatal hernia without obstruction -General surgery was consulted -Recommend to treat conservatively at this time -Recommended to eat small meals with protein shake supplements   Anemia -Serum iron is 16; saturation ratio is 5 -We will give IV Feraheme 510 mg x 1   Hypertension -Blood pressure is stable -Continue amlodipine, lisinopril   Scheduled medications:    amLODipine  5 mg Oral Daily   amoxicillin  1,000 mg Oral Q12H   clarithromycin  500 mg Oral Q12H   feeding supplement  1 Container Oral TID BM   lisinopril  40 mg Oral Daily   pantoprazole  40 mg Oral BID   rosuvastatin  10 mg Oral Daily   saccharomyces boulardii  250 mg Oral BID   sodium chloride flush  3 mL Intravenous Q12H          Data Reviewed:   CBG:  No results for input(s): GLUCAP in the last 168 hours.  SpO2: 100 %    Vitals:   05/19/21 0431 05/19/21 1330 05/19/21 1356 05/19/21 1645  BP: 132/62 118/71 (!) 106/58 (!) 141/60  Pulse: 72 65 70 66  Resp: 16 16 16 18   Temp: 97.9 F (36.6 C) 98.2 F (36.8 C) 98.2 F (36.8 C) 98.4 F (36.9 C)  TempSrc: Oral Oral    SpO2: 100% 99%  100%  Weight:      Height:         Intake/Output Summary (Last 24 hours) at 05/19/2021 1906 Last data filed at 05/19/2021 1645 Gross per 24 hour  Intake 1012.3 ml  Output --  Net 1012.3 ml    06/21 0701 - 06/22 1900 In: 1455.3 [P.O.:240; I.V.:203] Out: 300 [Urine:300]  Filed Weights   05/17/21 0927 05/18/21 0817  Weight: 60.2 kg 60.2 kg    CBC:  Recent Labs  Lab 05/17/21 0953 05/18/21 0608 05/18/21 1600 05/19/21 0527  WBC 6.2 4.6  --  4.7  HGB 8.5* 7.4* 7.6* 7.1*  HCT 27.2* 25.0* 26.8* 22.3*  PLT 336 265  --  263  MCV 74.7* 75.8*  --  79.1*  MCH 23.4* 22.4*  --  25.2*  MCHC 31.3 29.6*  --  31.8  RDW 22.3* 22.1*  --  23.7*  LYMPHSABS 0.8  --   --  1.0  MONOABS 0.5  --   --  0.5  EOSABS 0.0  --   --  0.1  BASOSABS 0.1  --   --  0.0    Complete metabolic panel:  Recent Labs  Lab 05/17/21 0953 05/18/21 0608 05/19/21 0527  NA 136 138 138  K 3.9 3.8 3.8  CL 103 107 107  CO2 22 22 26   GLUCOSE 121* 93 107*  BUN 16 13 13   CREATININE 0.95 0.89 1.00  CALCIUM 9.5 9.0 8.5*  AST 17  --  15  ALT 13  --  11  ALKPHOS 67  --  56  BILITOT 0.4  --  0.4  ALBUMIN 3.9  --  3.2*  MG  --   --  2.1    Recent Labs  Lab 05/17/21 0953  LIPASE 27    Recent Labs  Lab 05/17/21 1235  SARSCOV2NAA NEGATIVE    ------------------------------------------------------------------------------------------------------------------ No results for input(s): CHOL, HDL, LDLCALC, TRIG, CHOLHDL, LDLDIRECT in the last 72 hours.  No results found for:  HGBA1C ------------------------------------------------------------------------------------------------------------------ No results for input(s): TSH, T4TOTAL, T3FREE, THYROIDAB in the last 72 hours.  Invalid input(s): FREET3 ------------------------------------------------------------------------------------------------------------------ Recent Labs    05/19/21 0527  FERRITIN 6*  TIBC 320  IRON 16*    Coagulation profile No results for input(s): INR, PROTIME in the last 168 hours. No results for input(s): DDIMER in the last 72 hours.  Cardiac Enzymes No results for input(s): CKTOTAL, CKMB, CKMBINDEX, TROPONINI in the last 168 hours.  ------------------------------------------------------------------------------------------------------------------ No results found for: BNP   Antibiotics: Anti-infectives (From admission, onward)    Start     Dose/Rate Route Frequency Ordered Stop   05/19/21 1530  amoxicillin (AMOXIL) capsule 1,000 mg        1,000 mg Oral Every 12 hours 05/19/21 1436 06/02/21 0959   05/19/21 1530  clarithromycin (BIAXIN) tablet 500 mg        500 mg Oral Every 12 hours 05/19/21 1436 06/02/21 0959        Radiology Reports  DG UGI W SINGLE CM (SOL OR THIN BA)  Result Date: 05/18/2021 CLINICAL DATA:  87 year old female with history of hiatal hernia. EXAM: UPPER GI SERIES WITH KUB TECHNIQUE: After obtaining a scout radiograph a routine upper GI series was performed using thin and high density barium. FLUOROSCOPY TIME:  Fluoroscopy Time:  2 minutes and 18 seconds Radiation Exposure Index (if provided by the fluoroscopic device): 20.6 mGy COMPARISON:  None. FINDINGS: Preprocedural KUB demonstrates a nonobstructive bowel gas pattern, with no pneumoperitoneum. Subsequently, a limited single contrast imaging was performed. This demonstrated a mildly patulous esophagus with intermittent failure to fully propagate primary peristaltic waves. Tertiary contractions were  also noted. Moderate-sized hiatal hernia. Single contrast images of the stomach demonstrate what appears to be a shallow ulcer along the right-side of the lesser curvature of the stomach immediately distal to the esophageal hiatus (best appreciated on image 31 of series 7). The remainder of the stomach was otherwise grossly unremarkable in appearance on today's single contrast examination. Duodenal bulb was normal in appearance. IMPRESSION: 1. Shallow ulcer in the lesser curvature of the stomach immediately distal to the esophageal hiatus. 2. Moderate-sized hiatal hernia. 3. 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 Electronically Signed   By: 05/20/2021 M.D.   On: 05/18/2021 12:08      DVT prophylaxis: SCDs  Code Status: Full code  Family Communication: No family at bedside   Consultants: Gastroenterology General surgery  Procedures:     Objective    Physical Examination:  General-appears in no acute distress Heart-S1-S2, regular, no murmur auscultated Lungs-clear to auscultation bilaterally, no wheezing or crackles auscultated Abdomen-soft, nontender,  no organomegaly Extremities-no edema in the lower extremities Neuro-alert, oriented x3, no focal deficit noted  Status is: Inpatient  Dispo: The patient is from: Home              Anticipated d/c is to: Home              Anticipated d/c date is: 05/20/2021              Patient currently not stable for discharge  Barrier to discharge-gastric ulcer  COVID-19 Labs  Recent Labs    05/19/21 0527  FERRITIN 6*    Lab Results  Component Value Date   SARSCOV2NAA NEGATIVE 05/17/2021    Microbiology  Recent Results (from the past 240 hour(s))  Resp Panel by RT-PCR (Flu A&B, Covid) Nasopharyngeal Swab     Status: None   Collection Time: 05/17/21 12:35 PM   Specimen: Nasopharyngeal Swab; Nasopharyngeal(NP) swabs in vial transport medium  Result Value Ref Range Status   SARS Coronavirus 2  by RT PCR NEGATIVE NEGATIVE Final    Comment: (NOTE) SARS-CoV-2 target nucleic acids are NOT DETECTED.  The SARS-CoV-2 RNA is generally detectable in upper respiratory specimens during the acute phase of infection. The lowest concentration of SARS-CoV-2 viral copies this assay can detect is 138 copies/mL. A negative result does not preclude SARS-Cov-2 infection and should not be used as the sole basis for treatment or other patient management decisions. A negative result may occur with  improper specimen collection/handling, submission of specimen other than nasopharyngeal swab, presence of viral mutation(s) within the areas targeted by this assay, and inadequate number of viral copies(<138 copies/mL). A negative result must be combined with clinical observations, patient history, and epidemiological information. The expected result is Negative.  Fact Sheet for Patients:  BloggerCourse.com  Fact Sheet for Healthcare Providers:  SeriousBroker.it  This test is no t yet approved or cleared by the [place] FDA and  has been authorized for detection and/or diagnosis of SARS-CoV-2 by FDA under an Emergency Use Authorization (EUA). This EUA will remain  in effect (meaning this test can be used) for the duration of the COVID-19 declaration under Section 564(b)(1) of the Act, 21 U.S.C.section 360bbb-3(b)(1), unless the authorization is terminated  or revoked sooner.       Influenza A by PCR NEGATIVE NEGATIVE Final   Influenza B by PCR NEGATIVE NEGATIVE Final    Comment: (NOTE) The Xpert Xpress SARS-CoV-2/FLU/RSV plus assay is intended as an aid in the diagnosis of influenza from Nasopharyngeal swab specimens and should not be used as a sole basis for treatment. Nasal washings and aspirates are unacceptable for Xpert Xpress SARS-CoV-2/FLU/RSV testing.  Fact Sheet for Patients: BloggerCourse.com  Fact  Sheet for Healthcare Providers: SeriousBroker.it  This test is not yet approved or cleared by the [place] FDA and has been authorized for detection and/or diagnosis of SARS-CoV-2 by FDA under an Emergency Use Authorization (EUA). This EUA will remain in effect (meaning this test can be used) for the duration of the COVID-19 declaration under Section 564(b)(1) of the Act, 21 U.S.C. section 360bbb-3(b)(1), unless the authorization is terminated or revoked.  Performed at [facility], [address] [place], [place] [address]              Meredeth Ide   [facility] If 7PM-7AM, please contact night-coverage at [email or web address], Office  [phone]   05/19/2021, 7:06 PM  LOS: 2 days

## 2021-05-19 NOTE — Progress Notes (Addendum)
[facility] Progress Note  Erika Cardenas 87 y.o. July 14, 1928  CC:  Hiatal hernia with ulceration  Subjective: Patient states she had some confusion overnight, and she briefly though she was at home and not in the hospital.  She feels weak, especially with ambulation.  Had a few bites of scrambled eggs but felt nauseated. Denies vomiting. Is tolerating Boost Juices.  No melena or hematochezia.  ROS : Review of Systems  Gastrointestinal:  Positive for nausea. Negative for abdominal pain, blood in stool, constipation, diarrhea, heartburn, melena and vomiting.  Neurological:  Positive for weakness. Negative for loss of consciousness.     Objective: Vital signs in last 24 hours: Vitals:   05/18/21 2103 05/19/21 0431  BP: (!) 107/40 132/62  Pulse: 60 72  Resp: 16 16  Temp: 98.4 F (36.9 C) 97.9 F (36.6 C)  SpO2: 100% 100%    Physical Exam:  General:  Alert, cooperative, no distress  Head:  Normocephalic, without obvious abnormality, atraumatic  Eyes:  Conjunctival pallor, EOMs intact  Lungs:   Clear to auscultation bilaterally, respirations unlabored  Heart:  Regular rate and rhythm, S1, S2 normal  Abdomen:   Soft, non-tender, non-distended, normoactive bowel sounds active all four quadrants  Extremities: Extremities normal, atraumatic, no  edema    Lab Results: Recent Labs    05/18/21 0608 05/19/21 0527  NA 138 138  K 3.8 3.8  CL 107 107  CO2 22 26  GLUCOSE 93 107*  BUN 13 13  CREATININE 0.89 1.00  CALCIUM 9.0 8.5*  MG  --  2.1  PHOS  --  4.0   Recent Labs    05/17/21 0953 05/19/21 0527  AST 17 15  ALT 13 11  ALKPHOS 67 56  BILITOT 0.4 0.4  PROT 7.4 5.8*  ALBUMIN 3.9 3.2*   Recent Labs    05/17/21 0953 05/18/21 0608 05/18/21 1600 05/19/21 0527  WBC 6.2 4.6  --  4.7  NEUTROABS 4.8  --   --  3.1  HGB 8.5* 7.4* 7.6* 7.1*  HCT 27.2* 25.0* 26.8* 22.3*  MCV 74.7* 75.8*  --  79.1*  PLT 336 265  --  263   No results for input(s): LABPROT, INR  in the last 72 hours.    Assessment: Hiatal hernia with ulceration: EGD 05/18/21: Large hiatal hernia (9 cm). No obvious paraesophageal component. Non-bleeding gastric ulcer with no stigmata of bleeding. On anterior wall, near but not exactly at the diaphragmatic hiatus.   -Upper GI series 05/18/21: Shallow ulcer in the lesser curvature of the stomach immediately distal to the esophageal hiatus. Moderate-sized hiatal hernia. 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 -Hgb 7.1 -BUN 13/Cr 1.00, stable -Addedum: Biopsies revealed chronic active gastritis with intestinal metaplasia and Helicobacter pylori.   Plan: Surgical note reviewed, they recommend treatment of ulcer to see if this improves symptoms, as HH has been present for many years.  For H. Pylori, start clarithromycin 500 mg BID and amoxicillin 1000 mg BID for 2 weeks.  Start Florastor BID during antibiotic therapy.  Increase Protonix to BID dosing. Continue BID dosing for 2 months, then decrease to monthly dosing thereafter.  Continue to monitor CBC with transfusion as needed to maintain Hgb >7. Discussed with patient that she may require transfusion given ongoing weakness, particularly if Hgb drops further.  [facility] will follow.  Edrick Kins PA-C 05/19/2021, 9:11 AM  Contact #  [phone]

## 2021-05-19 NOTE — Plan of Care (Signed)
Pt resting comfortably in bed; no s/s of acute distress observed or reported; call light and phone within reach with bed at lowest position for safety.

## 2021-05-20 ENCOUNTER — Encounter (HOSPITAL_COMMUNITY): Payer: Self-pay | Admitting: Gastroenterology

## 2021-05-20 LAB — BPAM RBC
Blood Product Expiration Date: 202207262359
ISSUE DATE / TIME: 202206221329
Unit Type and Rh: 5100

## 2021-05-20 LAB — TYPE AND SCREEN
ABO/RH(D): O POS
Antibody Screen: NEGATIVE
Unit division: 0

## 2021-05-20 LAB — BASIC METABOLIC PANEL
Anion gap: 7 (ref 5–15)
BUN: 13 mg/dL (ref 8–23)
CO2: 25 mmol/L (ref 22–32)
Calcium: 8.8 mg/dL — ABNORMAL LOW (ref 8.9–10.3)
Chloride: 107 mmol/L (ref 98–111)
Creatinine, Ser: 0.88 mg/dL (ref 0.44–1.00)
GFR, Estimated: >60 mL/min (ref >60)
Glucose, Bld: 115 mg/dL — ABNORMAL HIGH (ref 70–99)
Potassium: 3.6 mmol/L (ref 3.5–5.1)
Sodium: 139 mmol/L (ref 135–145)

## 2021-05-20 LAB — CBC
HCT: 30.3 % — ABNORMAL LOW (ref 36.0–46.0)
Hemoglobin: 9.2 g/dL — ABNORMAL LOW (ref 12.0–15.0)
MCH: 23.5 pg — ABNORMAL LOW (ref 26.0–34.0)
MCHC: 30.4 g/dL (ref 30.0–36.0)
MCV: 77.5 fL — ABNORMAL LOW (ref 80.0–100.0)
Platelets: 283 10*3/uL (ref 150–400)
RBC: 3.91 MIL/uL (ref 3.87–5.11)
RDW: 24.1 % — ABNORMAL HIGH (ref 11.5–15.5)
WBC: 4.3 10*3/uL (ref 4.0–10.5)
nRBC: 0 % (ref 0.0–0.2)

## 2021-05-20 MED ORDER — CLARITHROMYCIN 500 MG PO TABS: 500.0000 mg | ORAL_TABLET | Freq: Two times a day (BID) | ORAL | 0 refills | Status: AC

## 2021-05-20 MED ORDER — SACCHAROMYCES BOULARDII 250 MG PO CAPS: 250.0000 mg | ORAL_CAPSULE | Freq: Two times a day (BID) | ORAL | 0 refills | Status: AC

## 2021-05-20 MED ORDER — PANTOPRAZOLE SODIUM 40 MG PO TBEC: 40.0000 mg | DELAYED_RELEASE_TABLET | Freq: Two times a day (BID) | ORAL | 1 refills | Status: DC

## 2021-05-20 MED ORDER — FERROUS SULFATE 325 (65 FE) MG PO TABS: 325.0000 mg | ORAL_TABLET | Freq: Every day | ORAL | Status: DC

## 2021-05-20 MED ORDER — AMOXICILLIN 500 MG PO CAPS: 1000.0000 mg | ORAL_CAPSULE | Freq: Two times a day (BID) | ORAL | 0 refills | Status: AC

## 2021-05-20 MED ORDER — FERROUS GLUCONATE 324 (38 FE) MG PO TABS: 324.0000 mg | ORAL_TABLET | Freq: Every day | ORAL | 3 refills | Status: DC

## 2021-05-20 MED ORDER — POLYETHYLENE GLYCOL 3350 17 G PO PACK: 17.0000 g | PACK | Freq: Every day | ORAL | 0 refills | Status: AC | PRN

## 2021-05-20 NOTE — Progress Notes (Signed)
2 Days Post-Op  Subjective: CC: Doing well.  Reports that she ate spaghetti for lunch yesterday as well as a boost breeze and banana this morning without any abdominal pain, nausea or vomiting.  Objective: Vital signs in last 24 hours: Temp:  [98.2 F (36.8 C)-98.4 F (36.9 C)] 98.3 F (36.8 C) (06/23 0447) Pulse Rate:  [58-74] 58 (06/23 0447) Resp:  [14-18] 14 (06/23 0447) BP: (106-141)/(58-71) 122/62 (06/23 0447) SpO2:  [98 %-100 %] 98 % (06/23 0447) Last BM Date: 05/18/21  Intake/Output from previous day: 06/22 0701 - 06/23 0700 In: 1489.3 [P.O.:360; Blood:1012.3; IV Piggyback:117] Out: -  Intake/Output this shift: No intake/output data recorded.  PE: Gen:  Alert, NAD, pleasant Lungs: Normal rate and effort  Abd: Soft, NT/ND, +BS Psych: A&Ox3  Skin: no rashes noted, warm and dry  Lab Results:  Recent Labs    05/19/21 0527 05/20/21 0705  WBC 4.7 4.3  HGB 7.1* 9.2*  HCT 22.3* 30.3*  PLT 263 283   BMET Recent Labs    05/19/21 0527 05/20/21 0705  NA 138 139  K 3.8 3.6  CL 107 107  CO2 26 25  GLUCOSE 107* 115*  BUN 13 13  CREATININE 1.00 0.88  CALCIUM 8.5* 8.8*   PT/INR No results for input(s): LABPROT, INR in the last 72 hours. CMP     Component Value Date/Time   NA 139 05/20/2021 0705   K 3.6 05/20/2021 0705   CL 107 05/20/2021 0705   CO2 25 05/20/2021 0705   GLUCOSE 115 (H) 05/20/2021 0705   BUN 13 05/20/2021 0705   CREATININE 0.88 05/20/2021 0705   CALCIUM 8.8 (L) 05/20/2021 0705   PROT 5.8 (L) 05/19/2021 0527   ALBUMIN 3.2 (L) 05/19/2021 0527   AST 15 05/19/2021 0527   ALT 11 05/19/2021 0527   ALKPHOS 56 05/19/2021 0527   BILITOT 0.4 05/19/2021 0527   GFRNONAA >60 05/20/2021 0705   Lipase     Component Value Date/Time   LIPASE 27 05/17/2021 0953       Studies/Results: DG UGI W SINGLE CM (SOL OR THIN BA)  Result Date: 05/18/2021 CLINICAL DATA:  87 year old female with history of hiatal hernia. EXAM: UPPER GI SERIES WITH  KUB TECHNIQUE: After obtaining a scout radiograph a routine upper GI series was performed using thin and high density barium. FLUOROSCOPY TIME:  Fluoroscopy Time:  2 minutes and 18 seconds Radiation Exposure Index (if provided by the fluoroscopic device): 20.6 mGy COMPARISON:  None. FINDINGS: Preprocedural KUB demonstrates a nonobstructive bowel gas pattern, with no pneumoperitoneum. Subsequently, a limited single contrast imaging was performed. This demonstrated a mildly patulous esophagus with intermittent failure to fully propagate primary peristaltic waves. Tertiary contractions were also noted. Moderate-sized hiatal hernia. Single contrast images of the stomach demonstrate what appears to be a shallow ulcer along the right-side of the lesser curvature of the stomach immediately distal to the esophageal hiatus (best appreciated on image 31 of series 7). The remainder of the stomach was otherwise grossly unremarkable in appearance on today's single contrast examination. Duodenal bulb was normal in appearance. IMPRESSION: 1. Shallow ulcer in the lesser curvature of the stomach immediately distal to the esophageal hiatus. 2. Moderate-sized hiatal hernia. 3. 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 Electronically Signed   By: Trudie Reed M.D.   On: 05/18/2021 12:08    Anti-infectives: Anti-infectives (From admission, onward)    Start     Dose/Rate Route Frequency Ordered Stop   05/19/21 1530  amoxicillin (AMOXIL) capsule 1,000 mg        1,000 mg Oral Every 12 hours 05/19/21 1436 06/02/21 0959   05/19/21 1530  clarithromycin (BIAXIN) tablet 500 mg        500 mg Oral Every 12 hours 05/19/21 1436 06/02/21 0959        Assessment/Plan Nausea, post-prandial small volume emesis Hiatal hernia without obstruction Non-bleeding gastric ulcer H. Pylori - EGD 6/21 by Dr. Matthias Hughs w/ non-bleeding gastric ulcer in anterior wall of the stomach, Bx pending - UGI 6/21 pre-procedure  KUB with non-obstructive bowel gas pattern. UGI with patulous esophagus with dysmotility, shallow ulcer just distal to esophagus along lesser curvature of the stomach, stomach and duodenum were otherwise normal in appearance -Patient is tolerating p.o. without abdominal pain, nausea or vomiting.  Okay for discharge from our standpoint. Recommend SOFT diet and protein shakes at discharge. H. Pylori tx per GI. Currently on clarithromycin, amoxicillin, and pantoprazole for this. Consider iron supplementation for anemia. Will reach out to [facility] to let them know patient is okay for discharge from our standpoint.    LOS: 3 days    Jacinto Halim , [facility] 05/20/2021, 9:50 AM Please see Amion for pager number during day hours 7:00am-4:30pm

## 2021-05-20 NOTE — Progress Notes (Signed)
[facility] Progress Note  Erika Cardenas 87 y.o. 08-12-1928  CC:  Hiatal hernia with ulceration due to H pylori infection  Subjective: Patient states she is feeling well.  She had some upper abdominal discomfort after eating a banana too quickly this morning.  Tolerated dinner last night.  Denies nausea/vomiting. Reports a small brown BM yesterday. Requests Miralax dose.  ROS : Review of Systems  Cardiovascular:  Negative for chest pain and palpitations.  Gastrointestinal:  Positive for abdominal pain and blood in stool. Negative for constipation, diarrhea, heartburn, melena, nausea and vomiting.     Objective: Vital signs in last 24 hours: Vitals:   05/19/21 2208 05/20/21 0447  BP: 116/60 122/62  Pulse: 74 (!) 58  Resp: 16 14  Temp: 98.2 F (36.8 C) 98.3 F (36.8 C)  SpO2: 98% 98%    Physical Exam:  General:  Alert, cooperative, no distress  Head:  Normocephalic, without obvious abnormality, atraumatic  Eyes:  Conjunctival pallor, EOMs intact  Lungs:   Clear to auscultation bilaterally, respirations unlabored  Heart:  Regular rate and rhythm, S1, S2 normal  Abdomen:   Soft, non-tender, non-distended, normoactive bowel sounds active all four quadrants  Extremities: Extremities normal, atraumatic, no  edema    Lab Results: Recent Labs    05/19/21 0527 05/20/21 0705  NA 138 139  K 3.8 3.6  CL 107 107  CO2 26 25  GLUCOSE 107* 115*  BUN 13 13  CREATININE 1.00 0.88  CALCIUM 8.5* 8.8*  MG 2.1  --   PHOS 4.0  --     Recent Labs    05/19/21 0527  AST 15  ALT 11  ALKPHOS 56  BILITOT 0.4  PROT 5.8*  ALBUMIN 3.2*    Recent Labs    05/19/21 0527 05/20/21 0705  WBC 4.7 4.3  NEUTROABS 3.1  --   HGB 7.1* 9.2*  HCT 22.3* 30.3*  MCV 79.1* 77.5*  PLT 263 283    No results for input(s): LABPROT, INR in the last 72 hours.    Assessment: Hiatal hernia with ulceration: EGD 05/18/21: Large hiatal hernia (9 cm). No obvious paraesophageal component.  Non-bleeding gastric ulcer with no stigmata of bleeding. On anterior wall, near but not exactly at the diaphragmatic hiatus.  Biopsies revealed chronic active gastritis with intestinal metaplasia and Helicobacter pylori.  -Upper GI series 05/18/21: Shallow ulcer in the lesser curvature of the stomach immediately distal to the esophageal hiatus. Moderate-sized hiatal hernia. 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 -Hgb 9.2, improved from 7.1 after 1u pRBCs -BUN 13/Cr 0.88, stable -Ferritin (6), iron (16), and iron saturation (5%) all low; one dose of Feraheme 6/22  Plan: Continue clarithromycin 500 mg BID and amoxicillin 1000 mg BID for 2 weeks.  Continue Florastor BID during antibiotic therapy.  Continue Protonix to BID for 2 months, then decrease to monthly dosing thereafter.  Start PO iron supplementation. Advised patient this can cause black stools and worsen constipation.  Use Miralax 1-2 times daily as needed for constipation.  Benefiber can also be used 1-2 times daily.  Follow-up appointment has been scheduled in our office for 7/7.  OK to discharge from a GI standpoint.  [facility] will sign off. Please contact us if we can be of any further assistance during this hospital stay.  Edrick Kins PA-C 05/20/2021, 10:10 AM  Contact #  [phone]

## 2021-05-20 NOTE — Discharge Summary (Signed)
Physician Discharge Summary  Erika Cardenas ZOX:[id number] DOB: 09/09/1928 DOA: 05/17/2021  PCP: Pcp, No  Admit date: 05/17/2021 Discharge date: 05/20/2021  Time spent: 60 minutes  Recommendations for Outpatient Follow-up:  Follow-up gastroenterology as outpatient   Discharge Diagnoses:  Active Problems:   Gastric ulcer   Discharge Condition: Stable  Diet recommendation: Regular diet  Filed Weights   05/17/21 0927 05/18/21 0817  Weight: 60.2 kg 60.2 kg    History of present illness:  87 year old female with medical history of aortic stenosis s/p aortic valve replacement, hypertension, trigeminal neuralgia presented with abdominal pain, nausea and vomiting.  She has history of gastric ulcer since she was a teenager.  No recent endoscopy.  Imaging in the ED showed possible gastric ulcer with microperforation.  GI was consulted.    Hospital Course:   Gastric ulcer -EGD showed large hiatal hernia, nonbleeding gastric ulcer which was biopsied; biopsy showed chronic active gastritis with intestinal metaplasia and Helicobacter pylori -Upper GI series showed shallow ulcer in the lesser curvature of stomach immediately distal to the esophageal hiatus.  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 -Continue PPI twice daily -Patient started on clarithromycin, amoxicillin, pantoprazole for treatment of H. Pylori for 2 weeks. -Stop taking aspirin for 2 months, can consider restarting aspirin after 2 months after discussion with PCP     Hiatal hernia without obstruction -General surgery was consulted -Recommend to treat conservatively at this time -Recommended to eat small meals with protein shake supplements     Anemia, iron deficiency -Serum iron is 16; saturation ratio is 5 -Patient was given 1 dose of  IV Feraheme 510 mg  -We will discharge on ferrous gluconate 304 mg daily     Hypertension -Blood pressure is stable -Continue amlodipine,  lisinopril  Procedures: EGD  Consultations: Gastroenterology General surgery  Discharge Exam: Vitals:   05/20/21 0447 05/20/21 1010  BP: 122/62 132/71  Pulse: (!) 58   Resp: 14   Temp: 98.3 F (36.8 C)   SpO2: 98%     General: Appears in no acute distress Cardiovascular: S1-S2, regular Respiratory: Clear to auscultation bilaterally  Discharge Instructions   Discharge Instructions     Diet - low sodium heart healthy   Complete by: As directed    Increase activity slowly   Complete by: As directed       Allergies as of 05/20/2021       Reactions   Coffea Arabica Nausea Only   Lac Bovis Diarrhea        Medication List     STOP taking these medications    aspirin EC 81 MG tablet       TAKE these medications    amLODipine 5 MG tablet Commonly known as: NORVASC Take 5 mg by mouth daily.   amoxicillin 500 MG capsule Commonly known as: AMOXIL Take 2 capsules (1,000 mg total) by mouth every 12 (twelve) hours for 13 days.   Cholecalciferol 50 MCG (2000 UT) Caps Take 2,000 Units by mouth daily.   clarithromycin 500 MG tablet Commonly known as: BIAXIN Take 1 tablet (500 mg total) by mouth every 12 (twelve) hours for 13 days.   Cyanocobalamin 2000 MCG Tbcr Take 2,000 mcg by mouth daily.   ferrous gluconate 324 MG tablet Commonly known as: FERGON Take 1 tablet (324 mg total) by mouth daily with breakfast.   Flax Seed Oil 1000 MG Caps Take 1,000 mg by mouth daily.   lisinopril 40 MG tablet Commonly known as: ZESTRIL Take 40  mg by mouth daily.   pantoprazole 40 MG tablet Commonly known as: PROTONIX Take 1 tablet (40 mg total) by mouth 2 (two) times daily. Take Protonix 40 mg po twice daily x 2 months then take Protonix 40 mg po daily   polyethylene glycol 17 g packet Commonly known as: MIRALAX / GLYCOLAX Take 17 g by mouth daily as needed for moderate constipation.   rosuvastatin 10 MG tablet Commonly known as: CRESTOR Take 10 mg by mouth  daily.   saccharomyces boulardii 250 MG capsule Commonly known as: FLORASTOR Take 1 capsule (250 mg total) by mouth 2 (two) times daily for 13 days.       Allergies  Allergen Reactions   Coffea Arabica Nausea Only   Lac Bovis Diarrhea    Follow-up Information     Edrick Kins, PA-C. Go on 06/03/2021.   Specialty: Physician Assistant [facility] appointment scheduled on July 7. Please arrive at 12:15pm in order to complete new patient paperwork. Contact information: [address] [place] [address] [phone]                  The results of significant diagnostics from this hospitalization (including imaging, microbiology, ancillary and laboratory) are listed below for reference.    Significant Diagnostic Studies: CT ABDOMEN PELVIS W CONTRAST  Result Date: 05/17/2021 CLINICAL DATA:  Nausea, vomiting, epigastric pain for 4 weeks, back pain, weight loss EXAM: CT ABDOMEN AND PELVIS WITH CONTRAST TECHNIQUE: Multidetector CT imaging of the abdomen and pelvis was performed using the standard protocol following bolus administration of intravenous contrast. CONTRAST:  OMNIPAQUE IOHEXOL 300 MG/ML  SOLN COMPARISON:  None. FINDINGS: Lower chest: No acute abnormality. Moderate hiatal hernia, incompletely imaged, with intrathoracic position of the gastric fundus. Hepatobiliary: No solid liver abnormality is seen. No gallstones, gallbladder wall thickening, or biliary dilatation. Pancreas: Unremarkable. No pancreatic ductal dilatation or surrounding inflammatory changes. Spleen: Normal in size without significant abnormality. Adrenals/Urinary Tract: Adrenal glands are unremarkable. Kidneys are normal, without renal calculi, solid lesion, or hydronephrosis. Bladder is unremarkable. Stomach/Bowel: There is mucosal thickening, edema, and small loculations of extraluminal air about the gastric fundus at the level of the diaphragmatic hiatus (series 2, image 9).  Appendix appears normal. No evidence of bowel wall thickening, distention, or inflammatory changes. Pancolonic diverticulosis. Vascular/Lymphatic: Aortic atherosclerosis. No enlarged abdominal or pelvic lymph nodes. Reproductive: No mass or other significant abnormality. Other: No abdominal wall hernia or abnormality. No abdominopelvic ascites. Musculoskeletal: No acute or significant osseous findings. IMPRESSION: 1. There is mucosal thickening, edema, and small loculations of extraluminal air about the gastric fundus at the level of the diaphragmatic hiatus. Findings are of concerning for gastric ulcer complicated by microperforation. Underlying malignancy is a significant differential consideration. 2. Moderate hiatal hernia, incompletely imaged, with intrathoracic position of the gastric fundus. 3. Pancolonic diverticulosis without evidence of acute diverticulitis. Aortic Atherosclerosis (ICD10-I70.0). Electronically Signed   By: Lauralyn Primes M.D.   On: 05/17/2021 11:32   DG UGI W SINGLE CM (SOL OR THIN BA)  Result Date: 05/18/2021 CLINICAL DATA:  87 year old female with history of hiatal hernia. EXAM: UPPER GI SERIES WITH KUB TECHNIQUE: After obtaining a scout radiograph a routine upper GI series was performed using thin and high density barium. FLUOROSCOPY TIME:  Fluoroscopy Time:  2 minutes and 18 seconds Radiation Exposure Index (if provided by the fluoroscopic device): 20.6 mGy COMPARISON:  None. FINDINGS: Preprocedural KUB demonstrates a nonobstructive bowel gas pattern, with no pneumoperitoneum. Subsequently,  a limited single contrast imaging was performed. This demonstrated a mildly patulous esophagus with intermittent failure to fully propagate primary peristaltic waves. Tertiary contractions were also noted. Moderate-sized hiatal hernia. Single contrast images of the stomach demonstrate what appears to be a shallow ulcer along the right-side of the lesser curvature of the stomach immediately distal  to the esophageal hiatus (best appreciated on image 31 of series 7). The remainder of the stomach was otherwise grossly unremarkable in appearance on today's single contrast examination. Duodenal bulb was normal in appearance. IMPRESSION: 1. Shallow ulcer in the lesser curvature of the stomach immediately distal to the esophageal hiatus. 2. Moderate-sized hiatal hernia. 3. 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 Electronically Signed   By: Trudie Reed M.D.   On: 05/18/2021 12:08    Microbiology: Recent Results (from the past 240 hour(s))  Resp Panel by RT-PCR (Flu A&B, Covid) Nasopharyngeal Swab     Status: None   Collection Time: 05/17/21 12:35 PM   Specimen: Nasopharyngeal Swab; Nasopharyngeal(NP) swabs in vial transport medium  Result Value Ref Range Status   SARS Coronavirus 2 by RT PCR NEGATIVE NEGATIVE Final    Comment: (NOTE) SARS-CoV-2 target nucleic acids are NOT DETECTED.  The SARS-CoV-2 RNA is generally detectable in upper respiratory specimens during the acute phase of infection. The lowest concentration of SARS-CoV-2 viral copies this assay can detect is 138 copies/mL. A negative result does not preclude SARS-Cov-2 infection and should not be used as the sole basis for treatment or other patient management decisions. A negative result may occur with  improper specimen collection/handling, submission of specimen other than nasopharyngeal swab, presence of viral mutation(s) within the areas targeted by this assay, and inadequate number of viral copies(<138 copies/mL). A negative result must be combined with clinical observations, patient history, and epidemiological information. The expected result is Negative.  Fact Sheet for Patients:  BloggerCourse.com  Fact Sheet for Healthcare Providers:  SeriousBroker.it  This test is no t yet approved or cleared by the [place] FDA and  has been  authorized for detection and/or diagnosis of SARS-CoV-2 by FDA under an Emergency Use Authorization (EUA). This EUA will remain  in effect (meaning this test can be used) for the duration of the COVID-19 declaration under Section 564(b)(1) of the Act, 21 U.S.C.section 360bbb-3(b)(1), unless the authorization is terminated  or revoked sooner.       Influenza A by PCR NEGATIVE NEGATIVE Final   Influenza B by PCR NEGATIVE NEGATIVE Final    Comment: (NOTE) The Xpert Xpress SARS-CoV-2/FLU/RSV plus assay is intended as an aid in the diagnosis of influenza from Nasopharyngeal swab specimens and should not be used as a sole basis for treatment. Nasal washings and aspirates are unacceptable for Xpert Xpress SARS-CoV-2/FLU/RSV testing.  Fact Sheet for Patients: BloggerCourse.com  Fact Sheet for Healthcare Providers: SeriousBroker.it  This test is not yet approved or cleared by the [place] FDA and has been authorized for detection and/or diagnosis of SARS-CoV-2 by FDA under an Emergency Use Authorization (EUA). This EUA will remain in effect (meaning this test can be used) for the duration of the COVID-19 declaration under Section 564(b)(1) of the Act, 21 U.S.C. section 360bbb-3(b)(1), unless the authorization is terminated or revoked.  Performed at [facility], [address] [place], [place] [address]      Labs: Basic Metabolic Panel: Recent Labs  Lab 05/17/21 2426 05/18/21 0608 05/19/21 0527 05/20/21 0705  NA 136 138 138 139  K 3.9  3.8 3.8 3.6  CL 103 107 107 107  CO2 22 22 26 25   GLUCOSE 121* 93 107* 115*  BUN 16 13 13 13   CREATININE 0.95 0.89 1.00 0.88  CALCIUM 9.5 9.0 8.5* 8.8*  MG  --   --  2.1  --   PHOS  --   --  4.0  --    Liver Function Tests: Recent Labs  Lab 05/17/21 0953 05/19/21 0527  AST 17 15  ALT 13 11  ALKPHOS 67 56  BILITOT 0.4 0.4  PROT 7.4 5.8*  ALBUMIN 3.9 3.2*   Recent  Labs  Lab 05/17/21 0953  LIPASE 27   No results for input(s): AMMONIA in the last 168 hours. CBC: Recent Labs  Lab 05/17/21 0953 05/18/21 0608 05/18/21 1600 05/19/21 0527 05/20/21 0705  WBC 6.2 4.6  --  4.7 4.3  NEUTROABS 4.8  --   --  3.1  --   HGB 8.5* 7.4* 7.6* 7.1* 9.2*  HCT 27.2* 25.0* 26.8* 22.3* 30.3*  MCV 74.7* 75.8*  --  79.1* 77.5*  PLT 336 265  --  263 283       Signed:  05/21/21 MD.  [facility] 05/20/2021, 11:41 AM

## 2021-05-20 NOTE — Discharge Instructions (Signed)
Hold aspirin for 2 months.  Can consider restarting aspirin after 2 months after discussion with your PCP

## 2022-01-11 ENCOUNTER — Encounter (HOSPITAL_BASED_OUTPATIENT_CLINIC_OR_DEPARTMENT_OTHER): Payer: Self-pay

## 2022-01-11 ENCOUNTER — Emergency Department (HOSPITAL_BASED_OUTPATIENT_CLINIC_OR_DEPARTMENT_OTHER)
Admission: EM | Admit: 2022-01-11 | Discharge: 2022-01-11 | Disposition: A | Discharge status: Home / Self Care | Payer: Medicare Other | Attending: Emergency Medicine | Admitting: Emergency Medicine

## 2022-01-11 ENCOUNTER — Other Ambulatory Visit: Payer: Self-pay

## 2022-01-11 DIAGNOSIS — R040 Epistaxis: Secondary | ICD-10-CM | POA: Diagnosis present

## 2022-01-11 DIAGNOSIS — I2510 Atherosclerotic heart disease of native coronary artery without angina pectoris: Secondary | ICD-10-CM | POA: Diagnosis not present

## 2022-01-11 DIAGNOSIS — I119 Hypertensive heart disease without heart failure: Secondary | ICD-10-CM | POA: Insufficient documentation

## 2022-01-11 MED ORDER — SILVER NITRATE-POT NITRATE 75-25 % EX MISC
1.0000 | Freq: Once | CUTANEOUS | Status: AC
  Administered 2022-01-11: 1 via TOPICAL
  Filled 2022-01-11: qty 10

## 2022-01-11 MED ORDER — LIDOCAINE-EPINEPHRINE (PF) 2 %-1:200000 IJ SOLN
10.0000 mL | Freq: Once | INTRAMUSCULAR | Status: AC
  Administered 2022-01-11: 10 mL
  Filled 2022-01-11: qty 20

## 2022-01-11 NOTE — ED Provider Notes (Signed)
Emergency Department Provider Note   I have reviewed the triage vital signs and the nursing notes.   HISTORY  Chief Complaint Epistaxis   HPI Erika Cardenas is a 88 y.o. female with past medical history reviewed below, not on anticoagulation, presents to the emergency department for evaluation of epistaxis.  Patient has had several episodes of epistaxis but ultimately stopped with holding pressure but then recur.  All episodes have been from the right nostril.  No lightheadedness, shortness of breath, fatigue. Patient denies any injury.    Past Medical History:  Diagnosis Date   Arthritis    Coronary artery disease    Hypertension     Review of Systems  Constitutional: No fever/chills ENT: No sore throat. Positive right nostril epistaxis.  Respiratory: Denies shortness of breath. Skin: Negative for rash. Neurological: Negative for headaches   ____________________________________________   PHYSICAL EXAM:  VITAL SIGNS: ED Triage Vitals [01/11/22 1006]  Enc Vitals Group     BP 134/70     Pulse      Resp 18     Temp 98.6 F (37 C)     Temp src      SpO2 98 %   Constitutional: Alert and oriented. Well appearing and in no acute distress. Eyes: Conjunctivae are normal.  Head: Atraumatic. Nose: No congestion/rhinnorhea.  No active bleeding from the right or left nostril.  The nasal mucosa is well-appearing.  There is an area on the right which appears to have recently bled with some overlying clot.  Mouth/Throat: Mucous membranes are moist.  Neck: No stridor.   Cardiovascular: Good peripheral circulation.    Respiratory: Normal respiratory effort.  Gastrointestinal: No distention.  Musculoskeletal: No gross deformities of extremities. Neurologic:  Normal speech and language.  Skin:  Skin is warm, dry and intact. No rash noted.  ____________________________________________   PROCEDURES  Procedure(s) performed:   .Epistaxis Management  Date/Time: 01/11/2022  10:48 AM Performed by: Maia Plan, MD Authorized by: Maia Plan, MD   Consent:    Consent obtained:  Verbal   Consent given by:  Patient   Risks, benefits, and alternatives were discussed: yes     Risks discussed:  Bleeding, infection, nasal injury and pain Universal protocol:    Patient identity confirmed:  Verbally with patient Anesthesia:    Anesthesia method:  Topical application   Topical anesthetic:  Lidocaine gel and epinephrine Procedure details:    Treatment site:  R anterior   Treatment method:  Silver nitrate   Treatment complexity:  Limited   Treatment episode: initial   Post-procedure details:    Assessment:  Bleeding stopped   Procedure completion:  Tolerated well, no immediate complications   ____________________________________________   INITIAL IMPRESSION / ASSESSMENT AND PLAN / ED COURSE  Pertinent labs & imaging results that were available during my care of the patient were reviewed by me and considered in my medical decision making (see chart for details).    Critical Interventions- Epistaxis mgmt.    Medications  lidocaine-EPINEPHrine (XYLOCAINE W/EPI) 2 %-1:200000 (PF) injection 10 mL (10 mLs Other Given by Other 01/11/22 1018)  silver nitrate applicators applicator 1 Stick (1 Stick Topical Given by Other 01/11/22 1019)    Reassessment after intervention: No breakthrough bleeding in the ED.    I did obtain Additional Historical Information from daughter at bedside.  I decided to review pertinent External Data, and in summary no recent ED visits for similar.   Clinical Laboratory Tests: Considered need for  CBC to assess for anemia but vitals are WNL and patient is not having anemia symptoms at this time.    Social Determinants of Health Risk patient is a non-smoker.   Medical Decision Making: Summary:  Patient presents to the emergency department with recurrent epistaxis at home.  No active bleeding but I do see an area in the right  nostril that appears to have recently been bleeding with some overlying clot.  She appears very well with normal vitals.  After lidocaine/epinephrine in the right nostril she tolerated silver nitrate application to the area of presumed bleeding.  Discussed management of recurrent epistaxis at home along with keeping the nasal passages moist with saline nasal spray.  Gave contact information for ENT on-call as well for follow-up.   Disposition: discharge  ____________________________________________  FINAL CLINICAL IMPRESSION(S) / ED DIAGNOSES  Final diagnoses:  Epistaxis    Note:  This document was prepared using Dragon voice recognition software and may include unintentional dictation errors.  Alona Bene, MD, [facility]    Ishika Chesterfield, Arlyss Repress, MD 01/12/22 [phone]

## 2022-01-11 NOTE — ED Triage Notes (Signed)
Pt brought in by family. Reports nose bleed that started yesterday. Right nostril bled heavily then stopped on its on. No bleeding during night. Some bleeding today. Noted to have dried blood around nostril. Takes baby asa daily

## 2022-01-11 NOTE — Discharge Instructions (Signed)
As we discussed, there are several techniques you can use to prevent or stop nosebleeds in the future.  Keep your nose moist either with saline spray several times a day or by applying a thin layer of Neosporin, bacitracin, or other antibiotic ointment to the inside of your nose once or twice a day. ° °If the bleeding starts up again, gently blow your nose into a tissue to clear the blood and clots. Then squeeze your nose shut tightly and DO NOT PEEK for at least 15 minutes.  This will resolve most nosebleeds. ° °If you continue to have trouble after trying these techniques, or anything seems out of the ordinary or concerns you, please return tot he Emergency Department. ° °

## 2022-02-25 ENCOUNTER — Encounter (HOSPITAL_BASED_OUTPATIENT_CLINIC_OR_DEPARTMENT_OTHER): Payer: Self-pay | Admitting: Emergency Medicine

## 2022-02-25 ENCOUNTER — Other Ambulatory Visit: Payer: Self-pay

## 2022-02-25 ENCOUNTER — Emergency Department (HOSPITAL_BASED_OUTPATIENT_CLINIC_OR_DEPARTMENT_OTHER)
Admission: EM | Admit: 2022-02-25 | Discharge: 2022-02-25 | Disposition: A | Discharge status: Home / Self Care | Payer: Medicare Other | Attending: Emergency Medicine | Admitting: Emergency Medicine

## 2022-02-25 DIAGNOSIS — R040 Epistaxis: Secondary | ICD-10-CM | POA: Diagnosis present

## 2022-02-25 DIAGNOSIS — Z79899 Other long term (current) drug therapy: Secondary | ICD-10-CM | POA: Diagnosis not present

## 2022-02-25 MED ORDER — OXYMETAZOLINE HCL 0.05 % NA SOLN: NASAL | 0 refills | Status: AC

## 2022-02-25 NOTE — Discharge Instructions (Addendum)
Two sprays in nostril that is bleeding, then soak guaze or tissue paper in Afrin and pack nose with it.  Apply compression for up to 20 minutes.  If no resolution of bleeding return to emergency department. ?

## 2022-02-25 NOTE — ED Triage Notes (Signed)
States had a nose bleed yesterday. No active bleeding noted at present, dried blood noted to right nare  ?

## 2022-02-25 NOTE — ED Provider Notes (Signed)
?[facility] EMERGENCY DEPARTMENT ?Provider Note ? ? ?CSN: [id number] ?Arrival date & time: 02/25/22  8563 ? ?  ? ?History ? ?Chief Complaint  ?Patient presents with  ? Epistaxis  ? ? ?Erika Cardenas is a 88 y.o. female. ? ?88 year old female presenting for epistaxis.  Presenting with daughter at bedside admits to bleeding that started this morning and resolved prior to arrival to emergency department.  Was previously taking aspirin for prosthetic heart valve however stopped due to history of hernia with ulceration.  States that she was feeling unwell the other day and took an aspirin and had bleeding the next day.  No bleeding at this time.  No lightheadedness, dizziness, or syncopal symptoms. ? ?The history is provided by the patient. No language interpreter was used.  ?Epistaxis ?Associated symptoms: no cough, no fever and no sore throat   ? ?  ? ?Home Medications ?Prior to Admission medications   ?Medication Sig Start Date End Date Taking? Authorizing Provider  ?amLODipine (NORVASC) 5 MG tablet Take 5 mg by mouth daily.    [provider]  ?Cholecalciferol 50 MCG (2000 UT) CAPS Take 2,000 Units by mouth daily.    [provider]  ?Cyanocobalamin 2000 MCG TBCR Take 2,000 mcg by mouth daily.    [provider]  ?ferrous gluconate (FERGON) 324 MG tablet Take 1 tablet (324 mg total) by mouth daily with breakfast. 05/20/21   Meredeth Ide, MD  ?Flaxseed, Linseed, (FLAX SEED OIL) 1000 MG CAPS Take 1,000 mg by mouth daily.    [provider]  ?lisinopril (ZESTRIL) 40 MG tablet Take 40 mg by mouth daily.    [provider]  ?pantoprazole (PROTONIX) 40 MG tablet Take 1 tablet (40 mg total) by mouth 2 (two) times daily. Take Protonix 40 mg po twice daily x 2 months then take Protonix 40 mg po daily 05/20/21 09/17/21  Meredeth Ide, MD  ?polyethylene glycol (MIRALAX / GLYCOLAX) 17 g packet Take 17 g by mouth daily as needed for moderate constipation. 05/20/21   Meredeth Ide,  MD  ?rosuvastatin (CRESTOR) 10 MG tablet Take 10 mg by mouth daily.    [provider]  ?   ? ?Allergies    ?Coffea arabica and Lac bovis   ? ?Review of Systems   ?Review of Systems  ?Constitutional:  Negative for chills and fever.  ?HENT:  Positive for nosebleeds. Negative for ear pain and sore throat.   ?Eyes:  Negative for pain and visual disturbance.  ?Respiratory:  Negative for cough and shortness of breath.   ?Cardiovascular:  Negative for chest pain and palpitations.  ?Gastrointestinal:  Negative for abdominal pain and vomiting.  ?Genitourinary:  Negative for dysuria and hematuria.  ?Musculoskeletal:  Negative for arthralgias and back pain.  ?Skin:  Negative for color change and rash.  ?Neurological:  Negative for seizures and syncope.  ?All other systems reviewed and are negative. ? ?Physical Exam ?Updated Vital Signs ?BP (!) 124/58 (BP Location: Right Arm)   Pulse 71   Temp 97.6 ?F (36.4 ?C) (Oral)   Resp 17   Ht 4\' 10"  (1.473 m)   Wt 58.4 kg   SpO2 100%   BMI 26.90 kg/m?  ?Physical Exam ?Vitals and nursing note reviewed.  ?Constitutional:   ?   General: She is not in acute distress. ?   Appearance: She is well-developed.  ?HENT:  ?   Head: Normocephalic and atraumatic.  ?   Nose: Nose normal.  ?  Comments: Dried blood on right nares.  No active bleeding.  No septal hematomas.  No blood in oropharynx. ?   Mouth/Throat:  ?   Lips: Pink.  ?   Mouth: Mucous membranes are moist.  ?   Pharynx: Oropharynx is clear.  ?Eyes:  ?   Conjunctiva/sclera: Conjunctivae normal.  ?Cardiovascular:  ?   Rate and Rhythm: Normal rate and regular rhythm.  ?   Heart sounds: No murmur heard. ?Pulmonary:  ?   Effort: Pulmonary effort is normal. No respiratory distress.  ?   Breath sounds: Normal breath sounds.  ?Abdominal:  ?   Palpations: Abdomen is soft.  ?   Tenderness: There is no abdominal tenderness.  ?Musculoskeletal:     ?   General: No swelling.  ?   Cervical back: Neck supple.  ?Skin: ?   General: Skin is  warm and dry.  ?   Capillary Refill: Capillary refill takes less than 2 seconds.  ?Neurological:  ?   Mental Status: She is alert.  ?Psychiatric:     ?   Mood and Affect: Mood normal.  ? ? ?ED Results / Procedures / Treatments   ?Labs ?(all labs ordered are listed, but only abnormal results are displayed) ?Labs Reviewed - No data to display ? ?EKG ?None ? ?Radiology ?No results found. ? ?Procedures ?Procedures  ? ? ?Medications Ordered in ED ?Medications - No data to display ? ?ED Course/ Medical Decision Making/ A&P ?  ?                        ?Medical Decision Making ? ?88 year old female presenting for epistaxis.  Patient is alert and oriented x3, no acute distress, afebrile, stable vital signs.  Physical exam demonstrates no active bleeding at this time.  No blood in oropharynx.  No septal hematomas.  Patient recommended for charge home and close follow-up with cardiology further recommendations on aspirin.  Afrin sent to pharmacy.  Patient recommended to use Afrin when bleeding occurs, packing nares with gauze, and compression for 20 minutes with recommendations to return to emergency department immediately if symptoms if bleeding does not resolve at that time.  ? ?Patient in no distress and overall condition improved here in the ED. Detailed discussions were had with the patient regarding current findings, and need for close f/u with PCP or on call doctor. The patient has been instructed to return immediately if the symptoms worsen in any way for re-evaluation. Patient verbalized understanding and is in agreement with current care plan. All questions answered prior to discharge. ? ? ? ? ? ? ? ?Final Clinical Impression(s) / ED Diagnoses ?Final diagnoses:  ?Epistaxis  ? ? ?Rx / DC Orders ?ED Discharge Orders   ? ? None  ? ?  ? ? ?  ?Franne Forts, DO ?02/25/22 1019 ? ?

## 2022-04-26 ENCOUNTER — Emergency Department (HOSPITAL_BASED_OUTPATIENT_CLINIC_OR_DEPARTMENT_OTHER)
Admission: EM | Admit: 2022-04-26 | Discharge: 2022-04-26 | Disposition: A | Discharge status: Home / Self Care | Payer: Medicare Other | Attending: Emergency Medicine | Admitting: Emergency Medicine

## 2022-04-26 ENCOUNTER — Encounter (HOSPITAL_BASED_OUTPATIENT_CLINIC_OR_DEPARTMENT_OTHER): Payer: Self-pay | Admitting: Emergency Medicine

## 2022-04-26 ENCOUNTER — Other Ambulatory Visit: Payer: Self-pay

## 2022-04-26 ENCOUNTER — Emergency Department (HOSPITAL_BASED_OUTPATIENT_CLINIC_OR_DEPARTMENT_OTHER): Payer: Medicare Other

## 2022-04-26 DIAGNOSIS — X58XXXA Exposure to other specified factors, initial encounter: Secondary | ICD-10-CM | POA: Insufficient documentation

## 2022-04-26 DIAGNOSIS — S99922A Unspecified injury of left foot, initial encounter: Secondary | ICD-10-CM | POA: Diagnosis present

## 2022-04-26 DIAGNOSIS — S90112A Contusion of left great toe without damage to nail, initial encounter: Secondary | ICD-10-CM | POA: Diagnosis not present

## 2022-04-26 DIAGNOSIS — M19041 Primary osteoarthritis, right hand: Secondary | ICD-10-CM

## 2022-04-26 DIAGNOSIS — S9032XA Contusion of left foot, initial encounter: Secondary | ICD-10-CM | POA: Diagnosis not present

## 2022-04-26 DIAGNOSIS — M19042 Primary osteoarthritis, left hand: Secondary | ICD-10-CM | POA: Diagnosis not present

## 2022-04-26 MED ORDER — DICLOFENAC SODIUM 1 % EX GEL: 2.0000 g | Freq: Four times a day (QID) | CUTANEOUS | 0 refills | Status: AC

## 2022-04-26 NOTE — ED Provider Notes (Signed)
[facility] EMERGENCY DEPARTMENT Provider Note   CSN: [id number] Arrival date & time: 04/26/22  1103     History  Chief Complaint  Patient presents with   Hand Pain    Erika Cardenas is a 88 y.o. female.  88 year old female brought in by daughter with concern for pain in both of her hands.  Patient states that she has a very large dog at home, played tug with the dog for about 4 days and then developed some numbness in her fingers as well as pain in both of her hands.  Notes that she has arthritis in both hands.  Patient has a history of prior gastric ulcers, avoids aspirin and NSAIDs.  Denies any wounds to the hands, redness.  Also notes bruising to her left foot and great toe.  Also request assistance with finding primary care to help with in-home health needs.  No other complaints or concerns today.      Home Medications Prior to Admission medications   Medication Sig Start Date End Date Taking? Authorizing Provider  diclofenac Sodium (VOLTAREN) 1 % GEL Apply 2 g topically 4 (four) times daily. 04/26/22  Yes Jeannie Fend, PA-C  amLODipine (NORVASC) 5 MG tablet Take 5 mg by mouth daily.    [provider]  Cholecalciferol 50 MCG (2000 UT) CAPS Take 2,000 Units by mouth daily.    [provider]  Cyanocobalamin 2000 MCG TBCR Take 2,000 mcg by mouth daily.    [provider]  ferrous gluconate (FERGON) 324 MG tablet Take 1 tablet (324 mg total) by mouth daily with breakfast. 05/20/21   Meredeth Ide, MD  Flaxseed, Linseed, (FLAX SEED OIL) 1000 MG CAPS Take 1,000 mg by mouth daily.    [provider]  lisinopril (ZESTRIL) 40 MG tablet Take 40 mg by mouth daily.    [provider]  oxymetazoline (AFRIN NASAL SPRAY) 0.05 % nasal spray Two sprays in nostril that is bleeding, then soak guaze or tissue paper in Afrin and pack nose with it. 02/25/22   Edwin Dada P, DO  pantoprazole (PROTONIX) 40 MG tablet Take 1 tablet (40 mg total) by  mouth 2 (two) times daily. Take Protonix 40 mg po twice daily x 2 months then take Protonix 40 mg po daily 05/20/21 09/17/21  Meredeth Ide, MD  polyethylene glycol (MIRALAX / GLYCOLAX) 17 g packet Take 17 g by mouth daily as needed for moderate constipation. 05/20/21   Meredeth Ide, MD  rosuvastatin (CRESTOR) 10 MG tablet Take 10 mg by mouth daily.    [provider]      Allergies    Coffea arabica and Lac bovis    Review of Systems   Review of Systems Negative except as per HPI Physical Exam Updated Vital Signs BP (!) 143/64   Pulse (!) 54   Temp 98.1 F (36.7 C)   Resp 14   Ht 4\' 10"  (1.473 m)   Wt 54.4 kg   SpO2 98%   BMI 25.08 kg/m  Physical Exam Vitals and nursing note reviewed.  Constitutional:      General: She is not in acute distress.    Appearance: She is well-developed. She is not diaphoretic.  HENT:     Head: Normocephalic and atraumatic.  Cardiovascular:     Pulses: Normal pulses.  Pulmonary:     Effort: Pulmonary effort is normal.  Musculoskeletal:        General: Swelling and tenderness present. No signs  of injury.     Comments: Arthritic changes bilateral hands, cap refill present, strong radial pulses present, altered sensation to tips of fingers. Mild generalized tenderness, no crepitus  Mild bruising to dorsum of left foot without bony tenderness, left great toe subungual hematoma   Skin:    General: Skin is warm and dry.     Findings: No erythema or rash.  Neurological:     Mental Status: She is alert and oriented to person, place, and time.     Sensory: Sensory deficit present.     Motor: No weakness.  Psychiatric:        Behavior: Behavior normal.    ED Results / Procedures / Treatments   Labs (all labs ordered are listed, but only abnormal results are displayed) Labs Reviewed - No data to display  EKG None  Radiology DG Hand Complete Left  Result Date: 04/26/2022 CLINICAL DATA:  Bilateral hand pain and numbness for 1 week.  EXAM: LEFT HAND - COMPLETE 3+ VIEW COMPARISON:  None Available. FINDINGS: There is no evidence of fracture or dislocation. Severe degenerative changes are seen involving the first carpometacarpal and fifth distal interphalangeal joints. Soft tissues are unremarkable. IMPRESSION: Findings consistent with osteoarthritis involving multiple joints. No acute abnormality seen. Electronically Signed   By: Lupita RaiderJames  Green Jr M.D.   On: 04/26/2022 12:51   DG Hand Complete Right  Result Date: 04/26/2022 CLINICAL DATA:  Bilateral hand pain and numbness for 1 week. EXAM: RIGHT HAND - COMPLETE 3+ VIEW COMPARISON:  None Available. FINDINGS: There is no evidence of fracture or dislocation. Severe degenerative changes seen involving the first carpometacarpal joint. Soft tissues are unremarkable. IMPRESSION: Severe osteoarthritis of first carpometacarpal joint. No acute abnormality seen. Electronically Signed   By: Lupita RaiderJames  Green Jr M.D.   On: 04/26/2022 12:52    Procedures Procedures    Medications Ordered in ED Medications - No data to display  ED Course/ Medical Decision Making/ A&P                           Medical Decision Making Amount and/or Complexity of Data Reviewed Radiology: ordered.   88 year old female with complaint of pain in both of her hands with tingling in her fingers for the past week.  Patient has known arthritis, has been playing tug with her large dog daily for 4 days before symptoms started.  Strong radial pulses present, capillary refill intact, she does have some generalized tenderness, mild swelling with arthritic changes.  Suspect arthritis which is flared up secondary to using her hands more playing tug however x-rays of both hands were obtained due to concern for age and possible fracture.  X-rays as ordered interpreted by me with arthritic changes, no acute bony injury, agree with radiologist interpretation.  Patient is advised to try capsaicin or Voltaren for her pain.  Patient is  also scheduled to follow-up with PCP next Wednesday for new patient visit to establish care.  Daughter to discuss concerns for insurance coverage of her hygienic supplies as well as assistance with in-home care. Patient is also scheduled to follow-up with hand specialist in 2 weeks.        Final Clinical Impression(s) / ED Diagnoses Final diagnoses:  Osteoarthritis of both hands, unspecified osteoarthritis type    Rx / DC Orders ED Discharge Orders          Ordered    diclofenac Sodium (VOLTAREN) 1 % GEL  4 times daily  04/26/22 1315              Jeannie Fend, PA-C 04/26/22 1353    Sloan Leiter, DO 04/28/22 [phone]

## 2022-04-26 NOTE — ED Triage Notes (Signed)
Pt arrives pov, to triage in wheelchair, c/o bilateral hand pain and numbness x 1 week. Tenderness noted. Reports playing with large dog and endorses possible reason for pain. Unable to see pcp until 6/14.AOx4, speech clear

## 2022-04-26 NOTE — Discharge Instructions (Addendum)
Follow-up with a primary care doctor for recheck and routine health maintenance. You can apply Voltaren gel to your hands as needed as prescribed.  You could also try capsaicin cream.

## 2022-05-04 ENCOUNTER — Encounter: Payer: Self-pay | Admitting: Family Medicine

## 2022-05-04 ENCOUNTER — Ambulatory Visit (INDEPENDENT_AMBULATORY_CARE_PROVIDER_SITE_OTHER): Payer: Medicare Other | Admitting: Family Medicine

## 2022-05-04 VITALS — BP 141/84 | HR 68 | Ht <= 58 in | Wt 128.2 lb

## 2022-05-04 DIAGNOSIS — R531 Weakness: Secondary | ICD-10-CM | POA: Diagnosis not present

## 2022-05-04 DIAGNOSIS — E782 Mixed hyperlipidemia: Secondary | ICD-10-CM

## 2022-05-04 DIAGNOSIS — R2681 Unsteadiness on feet: Secondary | ICD-10-CM | POA: Diagnosis not present

## 2022-05-04 DIAGNOSIS — M19041 Primary osteoarthritis, right hand: Secondary | ICD-10-CM | POA: Diagnosis not present

## 2022-05-04 DIAGNOSIS — I10 Essential (primary) hypertension: Secondary | ICD-10-CM | POA: Diagnosis not present

## 2022-05-04 DIAGNOSIS — M19042 Primary osteoarthritis, left hand: Secondary | ICD-10-CM

## 2022-05-04 NOTE — Patient Instructions (Signed)
Thank you for choosing [facility] for your Primary Care needs. I am excited for the opportunity to partner with you to meet your health care goals. It was a pleasure meeting you today!   Information on diet, exercise, and health maintenance recommendations are listed below. This is information to help you be sure you are on track for optimal health and monitoring.   Please look over this and let us know if you have any questions or if you have completed any of the health maintenance outside of [place] so that we can be sure your records are up to date.  ___________________________________________________________  MyChart:  For all urgent or time sensitive needs we ask that you please call the office to avoid delays. Our number is [phone]. MyChart is not constantly monitored and due to the large volume of messages a day, replies may take up to 72 business hours.  MyChart Policy: MyChart allows for you to see your visit notes, after visit summary, provider recommendations, lab and tests results, make an appointment, request refills, and contact your provider or the office for non-urgent questions or concerns. Providers are seeing patients during normal business hours and do not have built in time to review MyChart messages.  We ask that you allow a minimum of 3 business days for responses to MyChart messages. For this reason, please do not send urgent requests through MyChart. Please call the office at [phone]. New and ongoing conditions may require a visit. We have virtual and in-person visits available for your convenience.  Complex MyChart concerns may require a visit. Your provider may request you schedule a virtual or in-person visit to ensure we are providing the best care possible. MyChart messages sent after 11:00 AM on Friday will not be received by the provider until Monday morning.    Lab and Test Results: You will receive your lab and  test results on MyChart as soon as they are completed and results have been sent by the lab or testing facility. Due to this service, you will receive your results BEFORE your provider.  I review lab and test results each morning prior to seeing patients. Some results require collaboration with other providers to ensure you are receiving the most appropriate care. For this reason, we ask that you please allow a minimum of 3-5 business days from the time that ALL results have been received for your provider to receive and review lab and test results and contact you about these.  Most lab and test result comments from the provider will be sent through MyChart. Your provider may recommend changes to the plan of care, follow-up visits, repeat testing, ask questions, or request an office visit to discuss these results. You may reply directly to this message or call the office to provide information for the provider or set up an appointment. In some instances, you will be called with test results and recommendations. Please let us know if this is preferred and we will make note of this in your chart to provide this for you.    If you have not heard a response to your lab or test results in 5 business days from all results returning to MyChart, please call the office to let us know. We ask that you please avoid calling prior to this time unless there is an emergent concern. Due to high call volumes, this can delay the resulting process.  After Hours: For all non-emergency after hours needs,   please call the office at [phone] and select the option to reach the on-call  service. On-call services are shared between multiple [facility] offices and therefore it will not be possible to speak directly with your provider. On-call providers may provide medical advice and recommendations, but are unable to provide refills for maintenance medications.  For all emergency or urgent medical needs after normal business  hours, we recommend that you seek care at the closest Urgent Care or Emergency Department to ensure appropriate treatment in a timely manner.  [facility] has a 24 hour emergency room located on the ground floor for your convenience.   Urgent Concerns During the Business Day Providers are seeing patients from 8AM to 5PM with a busy schedule and are most often not able to respond to non-urgent calls until the end of the day or the next business day. If you should have URGENT concerns during the day, please call and speak to the nurse or schedule a same day appointment so that we can address your concern without delay.   Thank you, again, for choosing me as your health care partner. I appreciate your trust and look forward to learning more about you.   Satish Hammers B. Harriett Azar, DNP, FNP-C  ___________________________________________________________  Health Maintenance Recommendations Screening Testing Mammogram Every 1-2 years based on history and risk factors Starting at age 50 Pap Smear Ages 21-39 every 3 years Ages 30-65 every 5 years with HPV testing More frequent testing may be required based on results and history Colon Cancer Screening Every 1-10 years based on test performed, risk factors, and history Starting at age 45 Bone Density Screening Every 2-10 years based on history Starting at age 65 for women Recommendations for men differ based on medication usage, history, and risk factors AAA Screening One time ultrasound Men 65-75 years old who have ever smoked Lung Cancer Screening Low Dose Lung CT every 12 months Age 50-80 years with a 20 pack-year smoking history who still smoke or who have quit within the last 15 years  Screening Labs Routine  Labs: Complete Blood Count (CBC), Complete Metabolic Panel (CMP), Cholesterol (Lipid Panel) Every 6-12 months based on history and medications May be recommended more frequently based on current conditions or  previous results Hemoglobin A1c Lab Every 3-12 months based on history and previous results Starting at age 45 or earlier with diagnosis of diabetes, high cholesterol, BMI >26, and/or risk factors Frequent monitoring for patients with diabetes to ensure blood sugar control Thyroid Panel (TSH w/ T3 & T4) Every 6 months based on history, symptoms, and risk factors May be repeated more often if on medication HIV One time testing for all patients 13 and older May be repeated more frequently for patients with increased risk factors or exposure Hepatitis C One time testing for all patients 18 and older May be repeated more frequently for patients with increased risk factors or exposure Gonorrhea, Chlamydia Every 12 months for all sexually active persons 13-24 years Additional monitoring may be recommended for those who are considered high risk or who have symptoms PSA Men 40-54 years old with risk factors Additional screening may be recommended from age 55-69 based on risk factors, symptoms, and history  Vaccine Recommendations Tetanus Booster All adults every 10 years Flu Vaccine All patients 6 months and older every year COVID Vaccine All patients 12 years and older Initial dosing with booster May recommend additional booster based on age and health history HPV Vaccine 2 doses all patients   age 9-26 Dosing may be considered for patients over 26 Shingles Vaccine (Shingrix) 2 doses all adults 50 years and older Pneumonia (Pneumovax 23) All adults 65 years and older May recommend earlier dosing based on health history Pneumonia (Prevnar 13) All adults 65 years and older Dosed 1 year after Pneumovax 23 Pneumonia (Prevnar 20) All adults 65 years and older (adults 19-64 with certain conditions or risk factors) 1 dose  For those who have no received Prevnar 13 vaccine previously   Additional Screening, Testing, and Vaccinations may be recommended on an individualized basis based on  family history, health history, risk factors, and/or exposure.  __________________________________________________________  Diet Recommendations for All Patients  I recommend that all patients maintain a diet low in saturated fats, carbohydrates, and cholesterol. While this can be challenging at first, it is not impossible and small changes can make big differences.  Things to try: Decreasing the amount of soda, sweet tea, and/or juice to one or less per day and replace with water While water is always the first choice, if you do not like water you may consider adding a water additive without sugar to improve the taste other sugar free drinks Replace potatoes with a brightly colored vegetable at dinner Use healthy oils, such as canola oil or olive oil, instead of butter or hard margarine Limit your bread intake to two pieces or less a day Replace regular pasta with low carb pasta options Bake, broil, or grill foods instead of frying Monitor portion sizes  Eat smaller, more frequent meals throughout the day instead of large meals  An important thing to remember is, if you love foods that are not great for your health, you don't have to give them up completely. Instead, allow these foods to be a reward when you have done well. Allowing yourself to still have special treats every once in a while is a nice way to tell yourself thank you for working hard to keep yourself healthy.   Also remember that every day is a new day. If you have a bad day and "fall off the wagon", you can still climb right back up and keep moving along on your journey!  We have resources available to help you!  Some websites that may be helpful include: www.MyPlate.gov  Www.VeryWellFit.com _____________________________________________________________  Activity Recommendations for All Patients  I recommend that all adults get at least 20 minutes of moderate physical activity that elevates your heart rate at least 5  days out of the week.  Some examples include: Walking or jogging at a pace that allows you to carry on a conversation Cycling (stationary bike or outdoors) Water aerobics Yoga Weight lifting Dancing If physical limitations prevent you from putting stress on your joints, exercise in a pool or seated in a chair are excellent options.  Do determine your MAXIMUM heart rate for activity: 220 - YOUR AGE = MAX Heart Rate   Remember! Do not push yourself too hard.  Start slowly and build up your pace, speed, weight, time in exercise, etc.  Allow your body to rest between exercise and get good sleep. You will need more water than normal when you are exerting yourself. Do not wait until you are thirsty to drink. Drink with a purpose of getting in at least 8, 8 ounce glasses of water a day plus more depending on how much you exercise and sweat.    If you begin to develop dizziness, chest pain, abdominal pain, jaw pain, shortness of breath, headache,   vision changes, lightheadedness, or other concerning symptoms, stop the activity and allow your body to rest. If your symptoms are severe, seek emergency evaluation immediately. If your symptoms are concerning, but not severe, please let us know so that we can recommend further evaluation.     
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
## 2022-05-05 LAB — LIPID PANEL
Cholesterol: 126 mg/dL (ref 0–200)
HDL: 41.6 mg/dL (ref >39.00)
LDL Cholesterol: 52 mg/dL (ref 0–99)
NonHDL: 84.18
Total CHOL/HDL Ratio: 3
Triglycerides: 160 mg/dL — ABNORMAL HIGH (ref 0.0–149.0)
VLDL: 32 mg/dL (ref 0.0–40.0)

## 2022-05-05 LAB — COMPREHENSIVE METABOLIC PANEL
ALT: 10 U/L (ref 0–35)
AST: 9 U/L (ref 0–37)
Albumin: 4.1 g/dL (ref 3.5–5.2)
Alkaline Phosphatase: 79 U/L (ref 39–117)
BUN: 19 mg/dL (ref 6–23)
CO2: 23 mEq/L (ref 19–32)
Calcium: 9.2 mg/dL (ref 8.4–10.5)
Chloride: 107 mEq/L (ref 96–112)
Creatinine, Ser: 0.86 mg/dL (ref 0.40–1.20)
GFR: 58.88 mL/min — ABNORMAL LOW (ref >60.00)
Glucose, Bld: 104 mg/dL — ABNORMAL HIGH (ref 70–99)
Potassium: 4.3 mEq/L (ref 3.5–5.1)
Sodium: 139 mEq/L (ref 135–145)
Total Bilirubin: 0.3 mg/dL (ref 0.2–1.2)
Total Protein: 6.7 g/dL (ref 6.0–8.3)

## 2022-05-05 LAB — TSH: TSH: 2.7 u[IU]/mL (ref 0.35–5.50)

## 2022-05-05 LAB — CBC
HCT: 32.6 % — ABNORMAL LOW (ref 36.0–46.0)
Hemoglobin: 10.6 g/dL — ABNORMAL LOW (ref 12.0–15.0)
MCHC: 32.6 g/dL (ref 30.0–36.0)
MCV: 82.5 fl (ref 78.0–100.0)
Platelets: 217 10*3/uL (ref 150.0–400.0)
RBC: 3.95 Mil/uL (ref 3.87–5.11)
RDW: 14.9 % (ref 11.5–15.5)
WBC: 5.7 10*3/uL (ref 4.0–10.5)

## 2022-05-06 ENCOUNTER — Encounter: Payer: Self-pay | Admitting: *Deleted

## 2022-05-10 ENCOUNTER — Telehealth: Payer: Self-pay | Admitting: Family Medicine

## 2022-05-10 NOTE — Telephone Encounter (Signed)
Kaylyn gave verbal orders for RN [facility] orders [facility]).

## 2022-05-17 ENCOUNTER — Telehealth: Payer: Self-pay | Admitting: Family Medicine

## 2022-05-17 NOTE — Telephone Encounter (Signed)
Caller/Agency: Ihor Dow [facility] Callback Number: [phone] Requesting OT/PT/Skilled Nursing/Social Work/Speech Therapy: nursing Frequency:  2 x 1 , 1 x 4  Medication check,

## 2022-05-18 ENCOUNTER — Other Ambulatory Visit: Payer: Self-pay

## 2022-05-18 DIAGNOSIS — S91101A Unspecified open wound of right great toe without damage to nail, initial encounter: Secondary | ICD-10-CM

## 2022-05-18 MED ORDER — FERROUS GLUCONATE 324 (38 FE) MG PO TABS: 324.0000 mg | ORAL_TABLET | Freq: Every day | ORAL | 3 refills | Status: DC

## 2022-05-18 MED ORDER — PANTOPRAZOLE SODIUM 40 MG PO TBEC: 40.0000 mg | DELAYED_RELEASE_TABLET | Freq: Two times a day (BID) | ORAL | 1 refills | Status: DC

## 2022-05-18 NOTE — Telephone Encounter (Signed)
Spoke with [facility] nurse Debbie. Gave verbal orders. She also asked for a podiatry referral due to wound on foot. Also asked for two refills for pt, refills and referral entered.

## 2022-05-18 NOTE — Telephone Encounter (Signed)
LMOM for Debbie to return call.

## 2022-05-23 ENCOUNTER — Telehealth: Payer: Self-pay

## 2022-05-26 NOTE — Telephone Encounter (Signed)
Pt sees ortho for her hands/wrists.

## 2022-06-01 ENCOUNTER — Telehealth: Payer: Self-pay | Admitting: *Deleted

## 2022-06-01 NOTE — Telephone Encounter (Signed)
Spoke with pt's niece, [name] about scheduling 6 month follow up with Ladona Ridgel and wellness exam with health coach. She will have to check with another family member and will call back to schedule.

## 2022-06-02 ENCOUNTER — Telehealth: Payer: Self-pay | Admitting: Family Medicine

## 2022-06-02 NOTE — Telephone Encounter (Signed)
VO given.

## 2022-06-02 NOTE — Telephone Encounter (Signed)
Caller/Agency: Lynden Ang [facility]) Callback Number: [phone] Requesting OT/PT/Skilled Nursing/Social Work/Speech Therapy:  HH Back Aid Frequency: 1 w 2 effective 7.10.23

## 2022-06-08 NOTE — Telephone Encounter (Signed)
VO given.

## 2022-06-08 NOTE — Telephone Encounter (Signed)
HH would like to add PT eval as well. Same number as listed.

## 2022-06-14 ENCOUNTER — Telehealth: Payer: Self-pay | Admitting: Family Medicine

## 2022-06-14 NOTE — Telephone Encounter (Signed)
Caller/Agency: [facility]) Callback Number: [phone] ok to LVM Requesting OT/PT/Skilled Nursing/Social Work/Speech Therapy: OT Frequency: 1 w 5 starting today

## 2022-06-15 ENCOUNTER — Telehealth: Payer: Self-pay | Admitting: Family Medicine

## 2022-06-15 NOTE — Telephone Encounter (Signed)
LMOM giving VO for OT 1 w 5.

## 2022-06-15 NOTE — Telephone Encounter (Signed)
Vernona Rieger [facility]) called stating that the pt is no longer interested in [facility] PT services.

## 2022-07-13 ENCOUNTER — Telehealth: Payer: Self-pay | Admitting: Family Medicine

## 2022-07-13 NOTE — Telephone Encounter (Signed)
Ranovan the OT from [facility] wanted to update PCP and let her know that the pt is being discharged today from occupational therapy. OT states that the patient is doing good and improving. For any questions he can be reached at [phone].

## 2022-08-16 ENCOUNTER — Other Ambulatory Visit: Payer: Self-pay | Admitting: Family Medicine

## 2022-09-18 ENCOUNTER — Other Ambulatory Visit: Payer: Self-pay | Admitting: Family Medicine

## 2023-09-24 ENCOUNTER — Other Ambulatory Visit: Payer: Self-pay | Admitting: Family Medicine

## 2023-11-11 IMAGING — CR DG HAND COMPLETE 3+V*L*
3 series · 3 of 3 positions shown · non-contrast
Comparison: None Available.

CLINICAL DATA: Bilateral hand pain and numbness for 1 week.

EXAM:
LEFT HAND - COMPLETE 3+ VIEW

[x hand pa left]
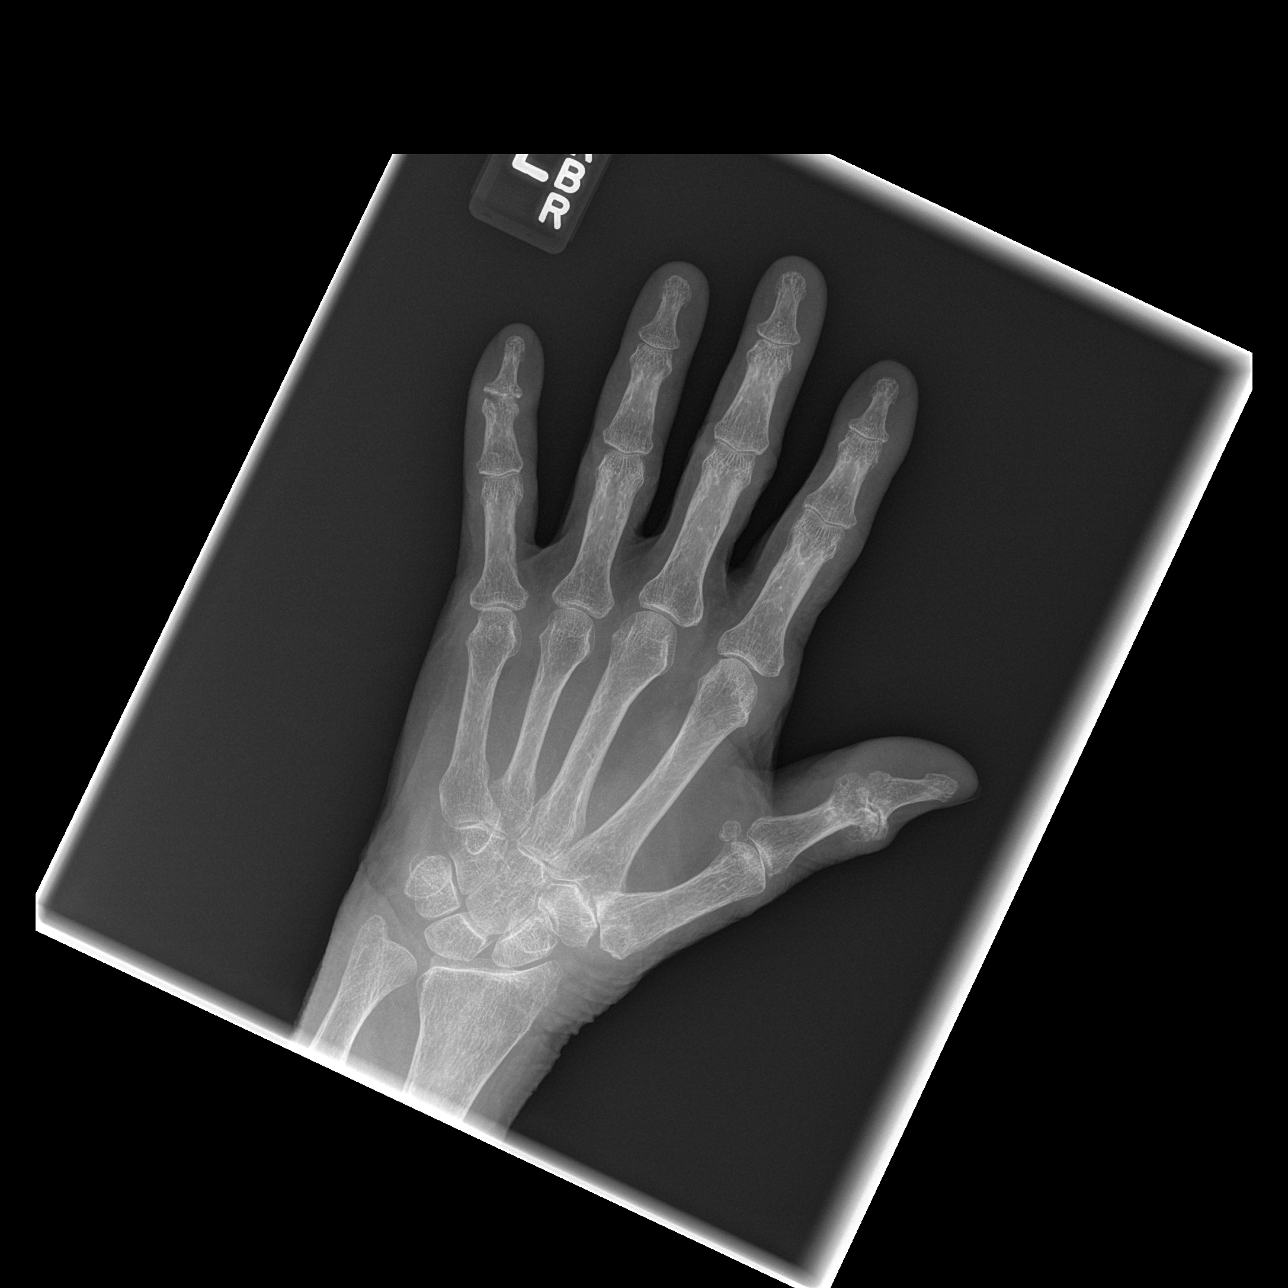

[x hand oblique left]
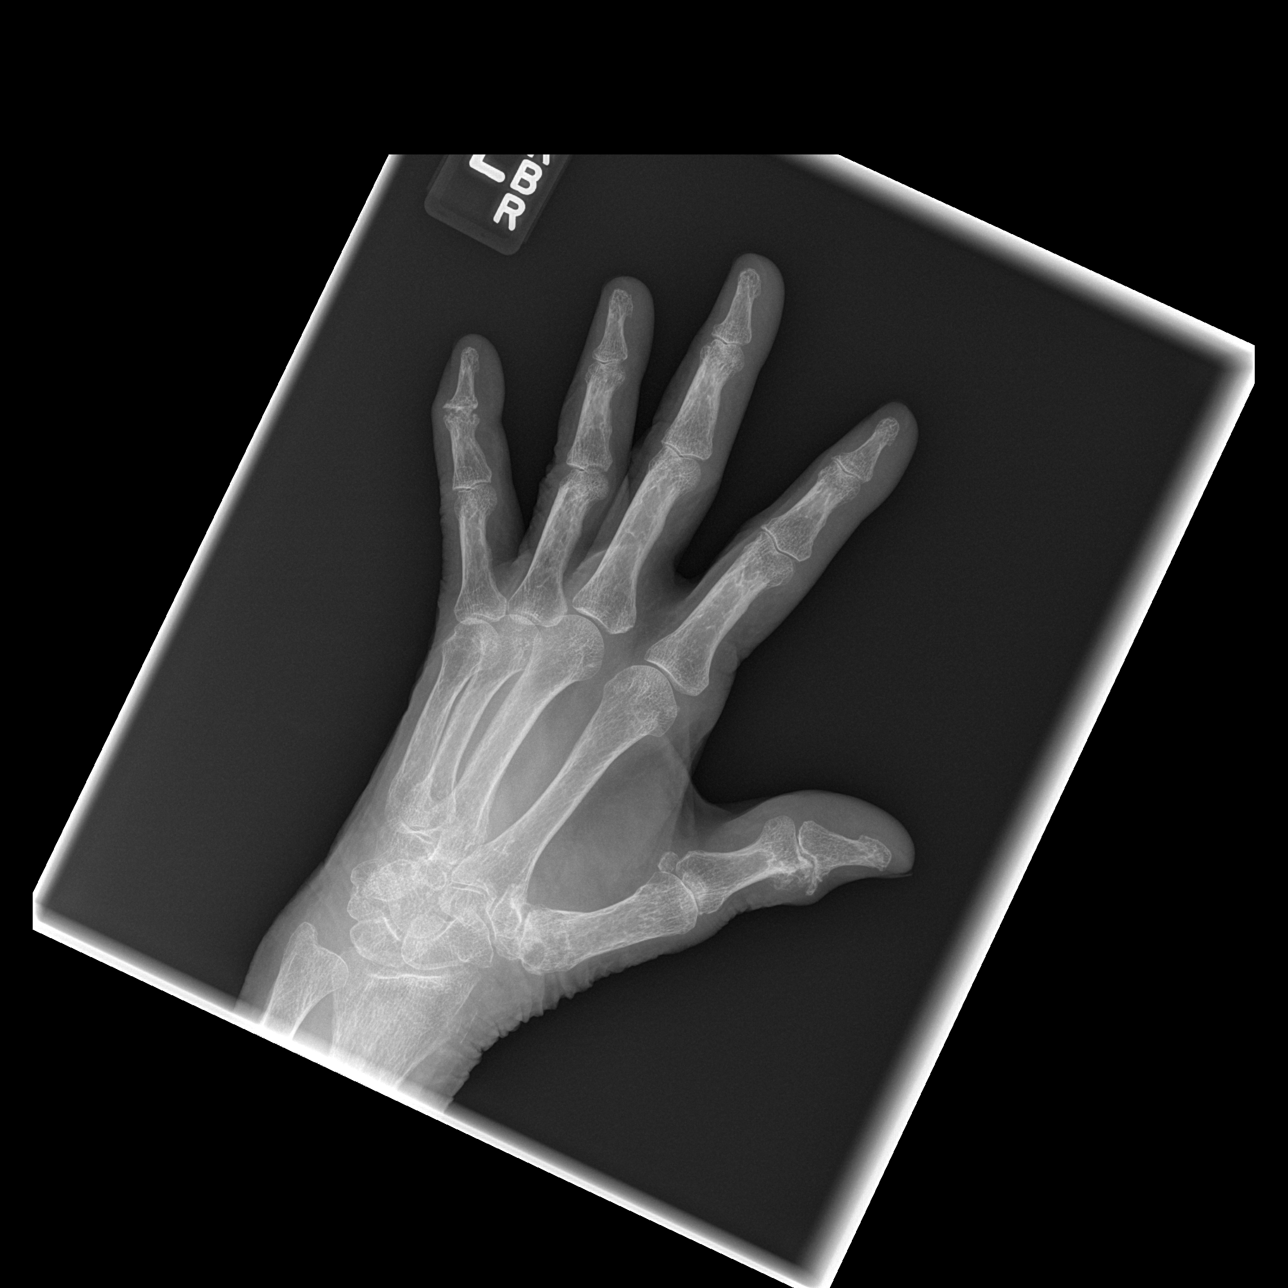

[x hand lat left]
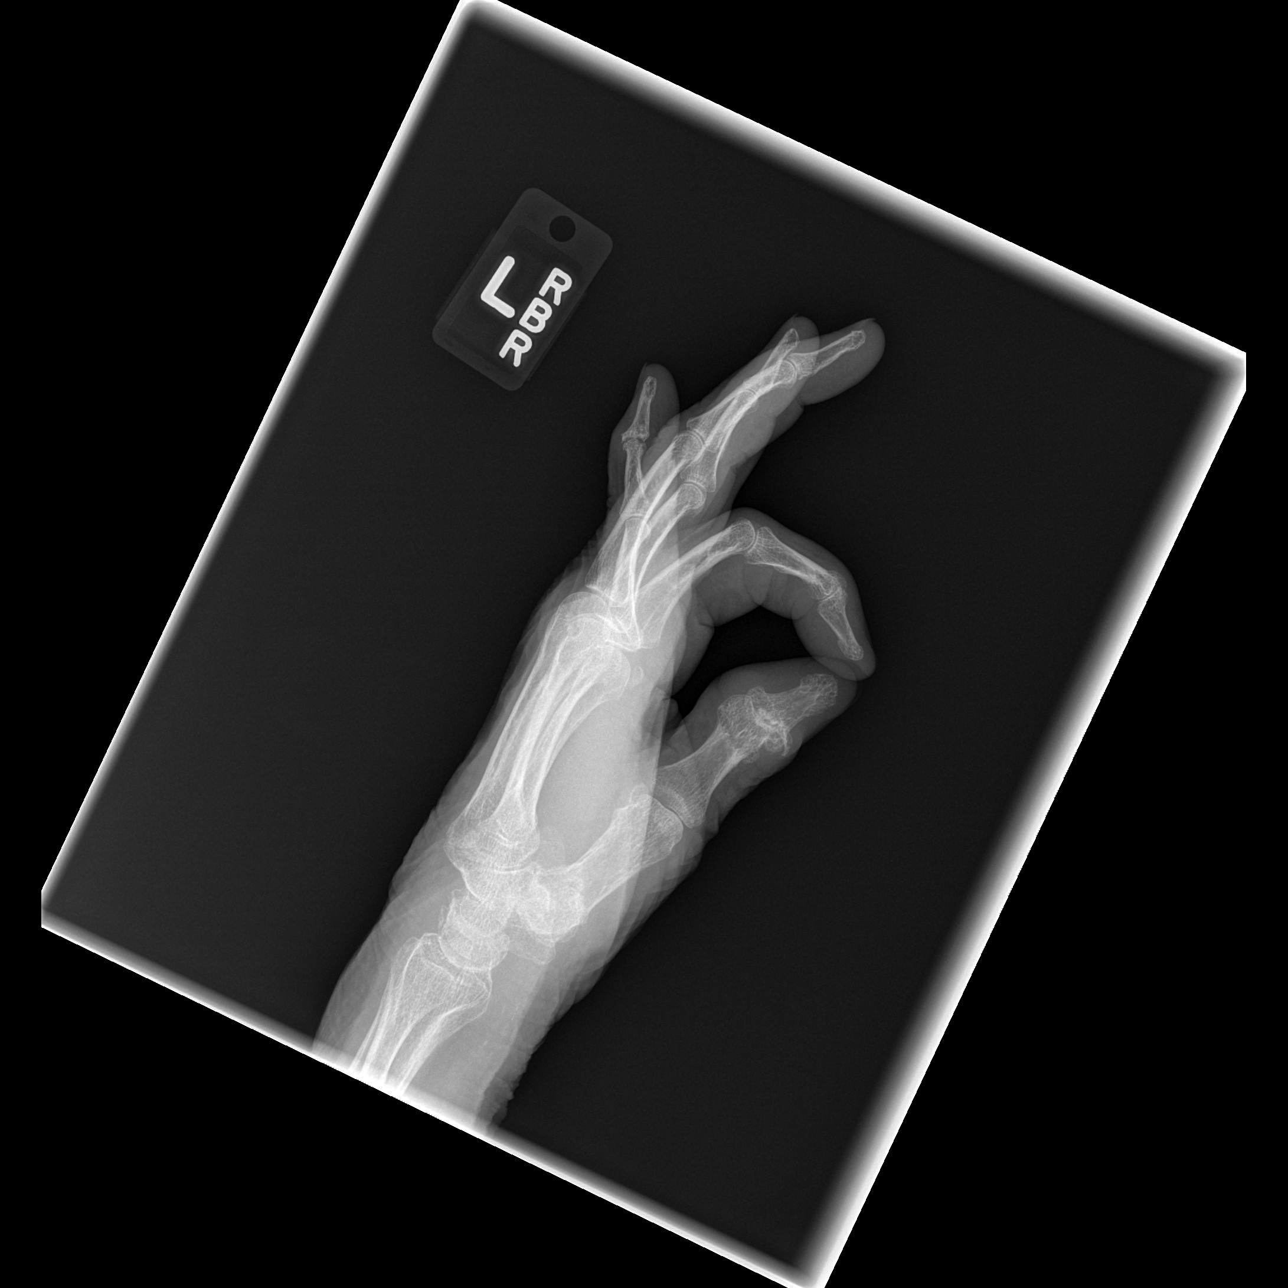

[3 of 3 positions shown; findings below may reference images not displayed]

FINDINGS: There is no evidence of fracture or dislocation. Severe degenerative
changes are seen involving the first carpometacarpal and fifth
distal interphalangeal joints. Soft tissues are unremarkable.
IMPRESSION: Findings consistent with osteoarthritis involving multiple joints.
No acute abnormality seen.

## 2023-12-01 ENCOUNTER — Other Ambulatory Visit: Payer: Self-pay | Admitting: Family Medicine

## 2023-12-05 ENCOUNTER — Other Ambulatory Visit: Payer: Self-pay | Admitting: Family Medicine
